# Patient Record
Sex: Female | Born: 1950 | Race: White | Hispanic: No | Marital: Married | Smoking: Never smoker
Health system: Southern US, Community
[De-identification: ages and names within clinical notes are randomized; demographics above are authoritative.]

## PROBLEM LIST (undated history)

## (undated) DIAGNOSIS — I10 Essential (primary) hypertension: Secondary | ICD-10-CM

## (undated) DIAGNOSIS — J45909 Unspecified asthma, uncomplicated: Secondary | ICD-10-CM

## (undated) DIAGNOSIS — E785 Hyperlipidemia, unspecified: Secondary | ICD-10-CM

## (undated) DIAGNOSIS — E119 Type 2 diabetes mellitus without complications: Secondary | ICD-10-CM

## (undated) HISTORY — PX: BARTHOLIN GLAND CYST EXCISION: SHX565

---

## 2004-06-12 ENCOUNTER — Ambulatory Visit: Payer: Self-pay | Admitting: General Practice

## 2004-06-18 ENCOUNTER — Ambulatory Visit: Payer: Self-pay | Admitting: General Practice

## 2004-08-16 ENCOUNTER — Ambulatory Visit: Payer: Self-pay | Admitting: Family Medicine

## 2004-08-22 ENCOUNTER — Ambulatory Visit: Payer: Self-pay | Admitting: Family Medicine

## 2005-02-03 ENCOUNTER — Ambulatory Visit: Payer: Self-pay | Admitting: General Surgery

## 2005-04-15 ENCOUNTER — Ambulatory Visit: Payer: Self-pay | Admitting: Unknown Physician Specialty

## 2005-08-07 ENCOUNTER — Ambulatory Visit: Payer: Self-pay | Admitting: General Practice

## 2006-08-11 ENCOUNTER — Ambulatory Visit: Payer: Self-pay | Admitting: Family Medicine

## 2007-08-17 ENCOUNTER — Ambulatory Visit: Payer: Self-pay | Admitting: Family Medicine

## 2007-08-28 ENCOUNTER — Ambulatory Visit: Payer: Self-pay | Admitting: Internal Medicine

## 2007-11-16 ENCOUNTER — Ambulatory Visit: Payer: Self-pay | Admitting: Gastroenterology

## 2008-08-17 ENCOUNTER — Ambulatory Visit: Payer: Self-pay | Admitting: Family Medicine

## 2008-08-31 ENCOUNTER — Ambulatory Visit: Payer: Self-pay | Admitting: Family Medicine

## 2009-11-27 ENCOUNTER — Ambulatory Visit: Payer: Self-pay | Admitting: Family Medicine

## 2010-12-25 ENCOUNTER — Ambulatory Visit: Payer: Self-pay

## 2011-12-02 ENCOUNTER — Ambulatory Visit: Payer: Self-pay

## 2012-01-21 HISTORY — PX: OTHER SURGICAL HISTORY: SHX169

## 2012-02-13 ENCOUNTER — Ambulatory Visit: Payer: Self-pay | Admitting: Unknown Physician Specialty

## 2012-12-02 ENCOUNTER — Ambulatory Visit: Payer: Self-pay | Admitting: Family Medicine

## 2013-11-02 ENCOUNTER — Ambulatory Visit: Payer: Self-pay | Admitting: Family Medicine

## 2013-12-05 ENCOUNTER — Ambulatory Visit: Payer: Self-pay | Admitting: Family Medicine

## 2014-01-20 HISTORY — PX: THYROID LOBECTOMY: SHX420

## 2014-01-30 ENCOUNTER — Ambulatory Visit: Payer: Self-pay | Admitting: Anesthesiology

## 2014-01-30 LAB — BASIC METABOLIC PANEL
ANION GAP: 7 (ref 7–16)
BUN: 19 mg/dL — ABNORMAL HIGH (ref 7–18)
CREATININE: 0.62 mg/dL (ref 0.60–1.30)
Calcium, Total: 8.9 mg/dL (ref 8.5–10.1)
Chloride: 104 mmol/L (ref 98–107)
Co2: 30 mmol/L (ref 21–32)
EGFR (Non-African Amer.): >60
GLUCOSE: 132 mg/dL — AB (ref 65–99)
Osmolality: 285 (ref 275–301)
Potassium: 3.5 mmol/L (ref 3.5–5.1)
Sodium: 141 mmol/L (ref 136–145)

## 2014-02-07 ENCOUNTER — Ambulatory Visit: Payer: Self-pay | Admitting: Surgery

## 2014-05-15 LAB — SURGICAL PATHOLOGY

## 2014-05-21 NOTE — Op Note (Signed)
PATIENT NAME:  Linda FlaxCANTRELL, [name] MR#:  [id number] DATE OF BIRTH:  [birth year]  DATE OF PROCEDURE:  02/07/2014  PREOPERATIVE DIAGNOSIS: Right thyroid colloid nodule.   POSTOPERATIVE DIAGNOSIS: Right thyroid colloid nodule.   PROCEDURE: Right thyroid lobectomy.   SURGEON: Renda RollsWilton Wynonna Fitzhenry, MD.   ANESTHESIA: General.   INDICATIONS: This 64 year old female has a 20 year history of a nodule on the right thyroid lobe which has recently been increasing in size, causing some difficulty breathing and difficulty swallowing and surgery was recommended for definitive treatment.   DESCRIPTION OF PROCEDURE: The patient was placed on the operating table in the supine position under general endotracheal anesthesia. A rolled sheet was placed behind the shoulder blades. The neck was extended. The neck and upper chest wall were prepared with ChloraPrep and draped in a sterile manner.   A collar incision was made approximately 1.5 cm above the sternal notch and carried down through subcutaneous tissues through the platysma. One traversing vein was divided between 4-0 Chromic suture ligatures. The strap muscles were identified. The sternocleidomastoid was dissected away from the strap muscles on the right side. The strap muscles were divided with the Harmonic scalpel on the right side and extending approximately 1 inch to the left of the midline. The strap muscles were separated from an underlying thyroid nodule as retraction was applied, exposure was achieved. The nodule appeared that it was approximately 3-4 cm in dimension and was mobilized with blunt and sharp dissection. The superior pole vessels were doubly ligated with 2-0 Vicryl and divided, the superior pole with the Harmonic scalpel. The nodule and lobe were further mobilized with additional blunt dissection. The superior and inferior parathyroid glands were identified and left intact. The inferior thyroidal artery was divided with the Harmonic scalpel and was  ligated with 3-0 Vicryl. It appeared that the dissection was enough anterior to avoid the recurrent laryngeal nerve, and further dissection was carried out with the Harmonic scalpel as the right lobe was excised and also divided. The isthmus and the thyroid were submitted for routine pathology. The wound was inspected and momentarily placed the patient in Trendelenburg position and saw no bleeding, was then returned to reverse Trendelenburg position and further demonstrated hemostasis was intact. Next the strap muscles were repaired with interrupted 3-0 Vicryl figure-of-eight sutures. The platysma was closed with interrupted 5-0 Monocryl. The skin was closed with running 5-0 Monocryl subcuticular suture and LiquiBand. The patient appeared to tolerate the procedure satisfactorily and was then prepared for transfer to the recovery room.     ____________________________ Shela CommonsJ. Renda RollsWilton Virginio Isidore, MD jws:bu D: 02/07/2014 14:26:47 ET T: 02/07/2014 17:02:48 ET JOB#: [id number]  cc: Adella HareJ. Wilton Ashlynd Michna, MD, <Dictator> Adella HareWILTON J Annison Birchard MD ELECTRONICALLY SIGNED 02/07/2014 18:39

## 2014-11-03 ENCOUNTER — Other Ambulatory Visit: Payer: Self-pay | Admitting: Family Medicine

## 2014-11-03 DIAGNOSIS — Z1231 Encounter for screening mammogram for malignant neoplasm of breast: Secondary | ICD-10-CM

## 2014-12-07 ENCOUNTER — Other Ambulatory Visit: Payer: Self-pay | Admitting: Family Medicine

## 2014-12-07 ENCOUNTER — Ambulatory Visit
Admission: RE | Admit: 2014-12-07 | Discharge: 2014-12-07 | Disposition: A | Discharge status: Home / Self Care | Payer: 59 | Source: Ambulatory Visit | Attending: Family Medicine | Admitting: Family Medicine

## 2014-12-07 DIAGNOSIS — Z1231 Encounter for screening mammogram for malignant neoplasm of breast: Secondary | ICD-10-CM

## 2014-12-07 DIAGNOSIS — N63 Unspecified lump in breast: Secondary | ICD-10-CM | POA: Diagnosis present

## 2014-12-07 DIAGNOSIS — N6320 Unspecified lump in the left breast, unspecified quadrant: Secondary | ICD-10-CM

## 2014-12-07 DIAGNOSIS — R928 Other abnormal and inconclusive findings on diagnostic imaging of breast: Secondary | ICD-10-CM | POA: Diagnosis not present

## 2015-11-01 ENCOUNTER — Other Ambulatory Visit: Payer: Self-pay | Admitting: Family Medicine

## 2015-11-01 DIAGNOSIS — Z1231 Encounter for screening mammogram for malignant neoplasm of breast: Secondary | ICD-10-CM

## 2015-12-10 ENCOUNTER — Ambulatory Visit: Payer: Self-pay

## 2015-12-11 ENCOUNTER — Ambulatory Visit
Admission: RE | Admit: 2015-12-11 | Discharge: 2015-12-11 | Disposition: A | Discharge status: Home / Self Care | Payer: BLUE CROSS/BLUE SHIELD | Source: Ambulatory Visit | Attending: Family Medicine | Admitting: Family Medicine

## 2015-12-11 DIAGNOSIS — Z1231 Encounter for screening mammogram for malignant neoplasm of breast: Secondary | ICD-10-CM | POA: Diagnosis not present

## 2016-11-04 ENCOUNTER — Other Ambulatory Visit: Payer: Self-pay | Admitting: Family Medicine

## 2016-11-04 DIAGNOSIS — Z1231 Encounter for screening mammogram for malignant neoplasm of breast: Secondary | ICD-10-CM

## 2016-12-15 ENCOUNTER — Ambulatory Visit
Admission: RE | Admit: 2016-12-15 | Discharge: 2016-12-15 | Disposition: A | Discharge status: Home / Self Care | Payer: Medicare HMO | Source: Ambulatory Visit | Attending: Family Medicine | Admitting: Family Medicine

## 2016-12-15 DIAGNOSIS — Z1231 Encounter for screening mammogram for malignant neoplasm of breast: Secondary | ICD-10-CM | POA: Insufficient documentation

## 2017-02-18 ENCOUNTER — Other Ambulatory Visit: Payer: Self-pay | Admitting: Family Medicine

## 2017-02-18 DIAGNOSIS — N6321 Unspecified lump in the left breast, upper outer quadrant: Secondary | ICD-10-CM

## 2017-02-18 DIAGNOSIS — N644 Mastodynia: Secondary | ICD-10-CM

## 2017-02-24 ENCOUNTER — Ambulatory Visit
Admission: RE | Admit: 2017-02-24 | Discharge: 2017-02-24 | Disposition: A | Discharge status: Home / Self Care | Payer: Medicare HMO | Source: Ambulatory Visit | Attending: Family Medicine | Admitting: Family Medicine

## 2017-02-24 DIAGNOSIS — N644 Mastodynia: Secondary | ICD-10-CM

## 2017-02-24 DIAGNOSIS — R928 Other abnormal and inconclusive findings on diagnostic imaging of breast: Secondary | ICD-10-CM | POA: Insufficient documentation

## 2017-02-24 DIAGNOSIS — N6321 Unspecified lump in the left breast, upper outer quadrant: Secondary | ICD-10-CM | POA: Insufficient documentation

## 2017-06-08 ENCOUNTER — Encounter: Payer: Self-pay | Admitting: *Deleted

## 2017-06-09 ENCOUNTER — Ambulatory Visit
Admission: RE | Admit: 2017-06-09 | Discharge: 2017-06-09 | Disposition: A | Discharge status: Home / Self Care | Payer: Medicare HMO | Source: Ambulatory Visit | Attending: Gastroenterology | Admitting: Gastroenterology

## 2017-06-09 ENCOUNTER — Ambulatory Visit: Payer: Medicare HMO | Admitting: Registered Nurse

## 2017-06-09 ENCOUNTER — Encounter: Payer: Self-pay | Admitting: *Deleted

## 2017-06-09 ENCOUNTER — Encounter
Admission: RE | Disposition: A | Discharge status: Home / Self Care | Payer: Self-pay | Source: Ambulatory Visit | Attending: Gastroenterology

## 2017-06-09 DIAGNOSIS — E785 Hyperlipidemia, unspecified: Secondary | ICD-10-CM | POA: Diagnosis not present

## 2017-06-09 DIAGNOSIS — Z7982 Long term (current) use of aspirin: Secondary | ICD-10-CM | POA: Insufficient documentation

## 2017-06-09 DIAGNOSIS — Z79899 Other long term (current) drug therapy: Secondary | ICD-10-CM | POA: Diagnosis not present

## 2017-06-09 DIAGNOSIS — E119 Type 2 diabetes mellitus without complications: Secondary | ICD-10-CM | POA: Insufficient documentation

## 2017-06-09 DIAGNOSIS — Z7984 Long term (current) use of oral hypoglycemic drugs: Secondary | ICD-10-CM | POA: Insufficient documentation

## 2017-06-09 DIAGNOSIS — K5730 Diverticulosis of large intestine without perforation or abscess without bleeding: Secondary | ICD-10-CM | POA: Diagnosis present

## 2017-06-09 DIAGNOSIS — I10 Essential (primary) hypertension: Secondary | ICD-10-CM | POA: Insufficient documentation

## 2017-06-09 DIAGNOSIS — Z8371 Family history of colonic polyps: Secondary | ICD-10-CM | POA: Diagnosis not present

## 2017-06-09 DIAGNOSIS — J45909 Unspecified asthma, uncomplicated: Secondary | ICD-10-CM | POA: Diagnosis not present

## 2017-06-09 HISTORY — DX: Hyperlipidemia, unspecified: E78.5

## 2017-06-09 HISTORY — DX: Type 2 diabetes mellitus without complications: E11.9

## 2017-06-09 HISTORY — DX: Unspecified asthma, uncomplicated: J45.909

## 2017-06-09 HISTORY — DX: Essential (primary) hypertension: I10

## 2017-06-09 HISTORY — PX: COLONOSCOPY WITH PROPOFOL: SHX5780

## 2017-06-09 LAB — GLUCOSE, CAPILLARY: GLUCOSE-CAPILLARY: 101 mg/dL — AB (ref 65–99)

## 2017-06-09 SURGERY — COLONOSCOPY WITH PROPOFOL
Anesthesia: General

## 2017-06-09 MED ORDER — LIDOCAINE HCL (PF) 2 % IJ SOLN
INTRAMUSCULAR | Status: AC
  Filled 2017-06-09: qty 10

## 2017-06-09 MED ORDER — PROPOFOL 10 MG/ML IV BOLUS
INTRAVENOUS | Status: DC | PRN
  Administered 2017-06-09: 60 mg via INTRAVENOUS

## 2017-06-09 MED ORDER — SODIUM CHLORIDE 0.9 % IV SOLN
INTRAVENOUS | Status: DC
  Administered 2017-06-09: 08:00:00 via INTRAVENOUS

## 2017-06-09 MED ORDER — GLYCOPYRROLATE 0.2 MG/ML IJ SOLN
INTRAMUSCULAR | Status: AC
  Filled 2017-06-09: qty 1

## 2017-06-09 MED ORDER — PROPOFOL 500 MG/50ML IV EMUL
INTRAVENOUS | Status: DC | PRN
  Administered 2017-06-09: 185 ug/kg/min via INTRAVENOUS

## 2017-06-09 MED ORDER — ONDANSETRON HCL 4 MG/2ML IJ SOLN
INTRAMUSCULAR | Status: DC | PRN
  Administered 2017-06-09: 4 mg via INTRAVENOUS

## 2017-06-09 MED ORDER — LIDOCAINE HCL (CARDIAC) PF 100 MG/5ML IV SOSY
PREFILLED_SYRINGE | INTRAVENOUS | Status: DC | PRN
  Administered 2017-06-09: 80 mg via INTRAVENOUS

## 2017-06-09 MED ORDER — PROPOFOL 500 MG/50ML IV EMUL
INTRAVENOUS | Status: AC
  Filled 2017-06-09: qty 50

## 2017-06-09 MED ORDER — SUCCINYLCHOLINE CHLORIDE 20 MG/ML IJ SOLN
INTRAMUSCULAR | Status: AC
  Filled 2017-06-09: qty 1

## 2017-06-09 NOTE — Anesthesia Postprocedure Evaluation (Signed)
Anesthesia Post Note  Patient: Linda Harrison  Procedure(s) Performed: COLONOSCOPY WITH PROPOFOL (N/A )  Patient location during evaluation: Endoscopy Anesthesia Type: General Level of consciousness: awake and alert Pain management: pain level controlled Vital Signs Assessment: post-procedure vital signs reviewed and stable Respiratory status: spontaneous breathing, nonlabored ventilation, respiratory function stable and patient connected to nasal cannula oxygen Cardiovascular status: blood pressure returned to baseline and stable Postop Assessment: no apparent nausea or vomiting Anesthetic complications: no     Last Vitals:  Vitals:   06/09/17 0806 06/09/17 0836  BP: (!) 93/56 (!) 148/72  Pulse:    Resp: 20   Temp: (!) 36.1 C   SpO2:      Last Pain:  Vitals:   06/09/17 0843  TempSrc:   PainSc: 0-No pain                 Lenard Simmer

## 2017-06-09 NOTE — Op Note (Signed)
[facility] Patient Name: Linda Harrison Procedure Date: 06/09/2017 7:36 AM MRN: [id number] Account #: [id number] Date of Birth: 1950-07-25 Admit Type: Outpatient Age: 67 Room: [facility] ENDO ROOM 3 Gender: Female Note Status: Finalized Procedure:            Colonoscopy Indications:          Family history of colonic polyps in a first-degree                        relative Providers:            Christena Deem, MD Referring MD:         Rhona Leavens. Burnett Sheng, MD (Referring MD) Medicines:            Monitored Anesthesia Care Complications:        No immediate complications. Procedure:            Pre-Anesthesia Assessment:                       - ASA Grade Assessment: II - A patient with mild                        systemic disease.                       After obtaining informed consent, the colonoscope was                        passed under direct vision. Throughout the procedure,                        the patient's blood pressure, pulse, and oxygen                        saturations were monitored continuously. The                        Colonoscope was introduced through the anus and                        advanced to the the cecum, identified by appendiceal                        orifice and ileocecal valve. The colonoscopy was                        performed without difficulty. The patient tolerated the                        procedure well. The quality of the bowel preparation                        was fair. Findings:      Many medium-mouthed diverticula were found in the sigmoid colon and       descending colon.      The exam was otherwise normal throughout the examined colon.      The retroflexed view of the distal rectum and anal verge was normal and       showed no anal or rectal abnormalities.      The digital rectal exam was normal. Impression:           -  Preparation of the colon was fair.                       - Diverticulosis in the  sigmoid colon and in the                        descending colon.                       - The distal rectum and anal verge are normal on                        retroflexion view.                       - No specimens collected. Recommendation:       - Repeat colonoscopy in 5 years for screening purposes. Procedure Code(s):    --- Professional ---                       [phone], Colonoscopy, flexible; diagnostic, including                        collection of specimen(s) by brushing or washing, when                        performed (separate procedure) Diagnosis Code(s):    --- Professional ---                       Z83.71, Family history of colonic polyps                       K57.30, Diverticulosis of large intestine without                        perforation or abscess without bleeding CPT copyright 2017 [employer]. All rights reserved. The codes documented in this report are preliminary and upon coder review may  be revised to meet current compliance requirements. Christena Deem, MD 06/09/2017 8:04:32 AM This report has been signed electronically. Number of Addenda: 0 Note Initiated On: 06/09/2017 7:36 AM Scope Withdrawal Time: 0 hours 11 minutes 25 seconds  Total Procedure Duration: 0 hours 19 minutes 30 seconds       [facility]

## 2017-06-09 NOTE — Transfer of Care (Signed)
Immediate Anesthesia Transfer of Care Note  Patient: Linda Harrison  Procedure(s) Performed: COLONOSCOPY WITH PROPOFOL (N/A )  Patient Location: PACU  Anesthesia Type:General  Level of Consciousness: awake  Airway & Oxygen Therapy: Patient Spontanous Breathing  Post-op Assessment: Post -op Vital signs reviewed and stable  Post vital signs: Reviewed and stable  Last Vitals:  Vitals Value Taken Time  BP 93/56 06/09/2017  8:07 AM  Temp 36.1 C 06/09/2017  8:06 AM  Pulse 60 06/09/2017  8:08 AM  Resp 15 06/09/2017  8:08 AM  SpO2 99 % 06/09/2017  8:08 AM  Vitals shown include unvalidated device data.  Last Pain:  Vitals:   06/09/17 0806  TempSrc: Tympanic  PainSc: 0-No pain         Complications: No apparent anesthesia complications

## 2017-06-09 NOTE — Anesthesia Preprocedure Evaluation (Addendum)
Anesthesia Evaluation  Patient identified by MRN, date of birth, ID band Patient awake    Reviewed: Allergy & Precautions, H&P , NPO status , Patient's Chart, lab work & pertinent test results, reviewed documented beta blocker date and time   Airway Mallampati: I  TM Distance: >3 FB Neck ROM: full    Dental  (+) Dental Advidsory Given, Caps Permanent bridge on the bottom right:   Pulmonary neg shortness of breath, asthma , neg sleep apnea, neg recent URI,           Cardiovascular Exercise Tolerance: Good hypertension, (-) angina(-) CAD, (-) Past MI and (-) Cardiac Stents (-) dysrhythmias (-) Valvular Problems/Murmurs     Neuro/Psych negative neurological ROS  negative psych ROS   GI/Hepatic negative GI ROS, Neg liver ROS,   Endo/Other  diabetes  Renal/GU negative Renal ROS  negative genitourinary   Musculoskeletal   Abdominal   Peds  Hematology negative hematology ROS (+)   Anesthesia Other Findings Past Medical History: No date: Asthma No date: Diabetes mellitus without complication (HCC) No date: Hyperlipidemia No date: Hypertension   Reproductive/Obstetrics negative OB ROS                            Anesthesia Physical Anesthesia Plan  ASA: II  Anesthesia Plan: General   Post-op Pain Management:    Induction: Intravenous  PONV Risk Score and Plan: 3 and Propofol infusion  Airway Management Planned: Natural Airway and Nasal Cannula  Additional Equipment:   Intra-op Plan:   Post-operative Plan:   Informed Consent: I have reviewed the patients History and Physical, chart, labs and discussed the procedure including the risks, benefits and alternatives for the proposed anesthesia with the patient or authorized representative who has indicated his/her understanding and acceptance.   Dental Advisory Given  Plan Discussed with: Anesthesiologist, CRNA and Surgeon  Anesthesia  Plan Comments:         Anesthesia Quick Evaluation

## 2017-06-09 NOTE — Anesthesia Post-op Follow-up Note (Signed)
Anesthesia QCDR form completed.        

## 2017-06-09 NOTE — H&P (Signed)
Outpatient short stay form Pre-procedure 06/09/2017 7:11 AM Christena Deem MD  Primary Physician: Jerl Mina, MD  Reason for visit: Colonoscopy  History of present illness: Patient is a 67 year old female with a family history of colon polyps in a primary relative.  Patient's last colonoscopy was 11/16/2007 with a finding of diverticulosis and some small nonbleeding internal hemorrhoids.    Current Facility-Administered Medications:  .  0.9 %  sodium chloride infusion, , Intravenous, Continuous, Christena Deem, MD .  0.9 %  sodium chloride infusion, , Intravenous, Continuous, Christena Deem, MD  Medications Prior to Admission  Medication Sig Dispense Refill Last Dose  . albuterol (PROVENTIL HFA;VENTOLIN HFA) 108 (90 Base) MCG/ACT inhaler Inhale 2 puffs into the lungs every 6 (six) hours as needed for wheezing or shortness of breath.     Marland Kitchen amLODipine (NORVASC) 5 MG tablet Take 5 mg by mouth daily.     Marland Kitchen aspirin EC 81 MG tablet Take 81 mg by mouth daily.     . bisoprolol-hydrochlorothiazide (ZIAC) 10-6.25 MG tablet Take 1 tablet by mouth daily.     . budesonide-formoterol (SYMBICORT) 160-4.5 MCG/ACT inhaler Inhale 2 puffs into the lungs 2 (two) times daily.     . cetirizine (ZYRTEC) 5 MG tablet Take 5 mg by mouth daily.     Marland Kitchen econazole nitrate 1 % cream Apply topically daily.     . Glucose Blood (ONETOUCH VERIO VI) by In Vitro route.     Marland Kitchen ibuprofen (ADVIL,MOTRIN) 200 MG tablet Take 200 mg by mouth every 6 (six) hours as needed.     . Lancets MISC by Does not apply route 3 (three) times daily.     . metFORMIN (GLUCOPHAGE) 1000 MG tablet Take 1,000 mg by mouth 2 (two) times daily with a meal.     . Multiple Vitamin (MULTIVITAMIN) tablet Take 1 tablet by mouth daily.     . mupirocin ointment (BACTROBAN) 2 % Place 1 application into the nose 3 (three) times daily.     Marland Kitchen omega-3 acid ethyl esters (LOVAZA) 1 g capsule Take 1 g by mouth 2 (two) times daily.     Marland Kitchen omeprazole  (PRILOSEC) 20 MG capsule Take 20 mg by mouth daily.     . simvastatin (ZOCOR) 20 MG tablet Take 20 mg by mouth daily.        Allergies  Allergen Reactions  . Advair Diskus [Fluticasone-Salmeterol]   . Augmentin [Amoxicillin-Pot Clavulanate]   . Codeine   . Latex      Past Medical History:  Diagnosis Date  . Asthma   . Diabetes mellitus without complication (HCC)   . Hyperlipidemia   . Hypertension     Review of systems:      Physical Exam    Heart and lungs: Regular rate and rhythm without rub or gallop, lungs are bilaterally clear.    HEENT: Normocephalic atraumatic eyes are anicteric    Other:    Pertinant exam for procedure: Soft nontender nondistended bowel sounds positive normoactive    Planned proceedures: Colonoscopy and indicated procedures.  I have discussed the risks benefits and complications of procedures to include not limited to bleeding, infection, perforation and the risk of sedation and the patient wishes to proceed.     Christena Deem, MD Gastroenterology 06/09/2017  7:11 AM

## 2017-06-11 ENCOUNTER — Encounter: Payer: Self-pay | Admitting: Gastroenterology

## 2017-10-15 ENCOUNTER — Other Ambulatory Visit: Payer: Self-pay | Admitting: Family Medicine

## 2017-10-15 DIAGNOSIS — Z1231 Encounter for screening mammogram for malignant neoplasm of breast: Secondary | ICD-10-CM

## 2017-12-23 ENCOUNTER — Ambulatory Visit
Admission: RE | Admit: 2017-12-23 | Discharge: 2017-12-23 | Disposition: A | Discharge status: Home / Self Care | Payer: Medicare HMO | Source: Ambulatory Visit | Attending: Family Medicine | Admitting: Family Medicine

## 2017-12-23 DIAGNOSIS — Z1231 Encounter for screening mammogram for malignant neoplasm of breast: Secondary | ICD-10-CM

## 2018-06-25 ENCOUNTER — Emergency Department
Admission: EM | Admit: 2018-06-25 | Discharge: 2018-06-25 | Disposition: A | Discharge status: Home / Self Care | Payer: Medicare HMO | Attending: Emergency Medicine | Admitting: Emergency Medicine

## 2018-06-25 ENCOUNTER — Encounter: Payer: Self-pay | Admitting: Emergency Medicine

## 2018-06-25 ENCOUNTER — Other Ambulatory Visit: Payer: Self-pay

## 2018-06-25 ENCOUNTER — Emergency Department: Payer: Medicare HMO

## 2018-06-25 DIAGNOSIS — I10 Essential (primary) hypertension: Secondary | ICD-10-CM | POA: Insufficient documentation

## 2018-06-25 DIAGNOSIS — M7660 Achilles tendinitis, unspecified leg: Secondary | ICD-10-CM

## 2018-06-25 DIAGNOSIS — Z91040 Latex allergy status: Secondary | ICD-10-CM | POA: Diagnosis not present

## 2018-06-25 DIAGNOSIS — M25571 Pain in right ankle and joints of right foot: Secondary | ICD-10-CM | POA: Insufficient documentation

## 2018-06-25 DIAGNOSIS — E119 Type 2 diabetes mellitus without complications: Secondary | ICD-10-CM | POA: Insufficient documentation

## 2018-06-25 DIAGNOSIS — J45909 Unspecified asthma, uncomplicated: Secondary | ICD-10-CM | POA: Insufficient documentation

## 2018-06-25 DIAGNOSIS — Z7984 Long term (current) use of oral hypoglycemic drugs: Secondary | ICD-10-CM | POA: Diagnosis not present

## 2018-06-25 DIAGNOSIS — Z79899 Other long term (current) drug therapy: Secondary | ICD-10-CM | POA: Diagnosis not present

## 2018-06-25 DIAGNOSIS — Z7982 Long term (current) use of aspirin: Secondary | ICD-10-CM | POA: Diagnosis not present

## 2018-06-25 MED ORDER — ONDANSETRON HCL 4 MG PO TABS: 4.0000 mg | ORAL_TABLET | Freq: Three times a day (TID) | ORAL | 0 refills | Status: DC | PRN

## 2018-06-25 MED ORDER — TRAMADOL HCL 50 MG PO TABS
50.0000 mg | ORAL_TABLET | Freq: Once | ORAL | Status: AC
  Administered 2018-06-25: 50 mg via ORAL
  Filled 2018-06-25: qty 1

## 2018-06-25 MED ORDER — TRAMADOL HCL 50 MG PO TABS: 50.0000 mg | ORAL_TABLET | Freq: Four times a day (QID) | ORAL | 0 refills | Status: AC | PRN

## 2018-06-25 MED ORDER — TRAMADOL HCL 50 MG PO TABS: 50.0000 mg | ORAL_TABLET | Freq: Four times a day (QID) | ORAL | 0 refills | Status: DC | PRN

## 2018-06-25 MED ORDER — FREE SPIRIT KNEE/LEG WALKER MISC: 1.0000 | Freq: Once | 0 refills | Status: AC

## 2018-06-25 MED ORDER — ONDANSETRON 4 MG PO TBDP
4.0000 mg | ORAL_TABLET | Freq: Once | ORAL | Status: AC
  Administered 2018-06-25: 4 mg via ORAL
  Filled 2018-06-25: qty 1

## 2018-06-25 MED ORDER — ONDANSETRON HCL 4 MG PO TABS: 4.0000 mg | ORAL_TABLET | Freq: Three times a day (TID) | ORAL | 0 refills | Status: AC | PRN

## 2018-06-25 NOTE — ED Notes (Signed)
See triage note  Presents with right ankle pain  States she felt a pop to ankle while walking   No deformity noted  Good pulses

## 2018-06-25 NOTE — ED Provider Notes (Signed)
[facility] Emergency Department Provider Note  ____________________________________________  Time seen: Approximately 7:37 PM  I have reviewed the triage vital signs and the nursing notes.   HISTORY  Chief Complaint Ankle Pain    HPI Linda Harrison is a 68 y.o. female presents to the emergency department with acute posterior right ankle pain.  Patient reports that she was walking in the kitchen and felt a popping sensation.  Patient states that she has been under the care of Dr. Alberteen Spindle for the past year for Achilles tendinitis and conveys that Dr. Alberteen Spindle told her she was at increased risk for Achilles tendon rupture or partial rupture.  Patient has not been able to fully bear weight since injury occurred without pain.  She denies numbness or tingling in the lower extremities or coldness.  She has not experience prior Achilles tendon rupture type injuries.  No other alleviating measures have been attempted.        Past Medical History:  Diagnosis Date  . Asthma   . Diabetes mellitus without complication (HCC)   . Hyperlipidemia   . Hypertension     There are no active problems to display for this patient.   Past Surgical History:  Procedure Laterality Date  . ARTHROSCOPY OF KNEE Left 01/2012  . BARTHOLIN GLAND CYST EXCISION    . CESAREAN SECTION  1979  . COLONOSCOPY WITH PROPOFOL N/A 06/09/2017   Procedure: COLONOSCOPY WITH PROPOFOL;  Surgeon: Christena Deem, MD;  Location: [facility] ENDOSCOPY;  Service: Endoscopy;  Laterality: N/A;  . THYROID LOBECTOMY Right 01/2014    Prior to Admission medications   Medication Sig Start Date End Date Taking? Authorizing Provider  albuterol (PROVENTIL HFA;VENTOLIN HFA) 108 (90 Base) MCG/ACT inhaler Inhale 2 puffs into the lungs every 6 (six) hours as needed for wheezing or shortness of breath.    [provider]  amLODipine (NORVASC) 5 MG tablet Take 5 mg by mouth daily.    [provider]   aspirin EC 81 MG tablet Take 81 mg by mouth daily.    [provider]  bisoprolol-hydrochlorothiazide (ZIAC) 10-6.25 MG tablet Take 1 tablet by mouth daily.    [provider]  budesonide-formoterol (SYMBICORT) 160-4.5 MCG/ACT inhaler Inhale 2 puffs into the lungs 2 (two) times daily.    [provider]  cetirizine (ZYRTEC) 5 MG tablet Take 5 mg by mouth daily.    [provider]  econazole nitrate 1 % cream Apply topically daily.    [provider]  Glucose Blood (ONETOUCH VERIO VI) by In Vitro route.    [provider]  ibuprofen (ADVIL,MOTRIN) 200 MG tablet Take 200 mg by mouth every 6 (six) hours as needed.    [provider]  Lancets MISC by Does not apply route 3 (three) times daily.    [provider]  metFORMIN (GLUCOPHAGE) 1000 MG tablet Take 1,000 mg by mouth 2 (two) times daily with a meal.    [provider]  Misc. Devices (FREE SPIRIT KNEE/LEG WALKER) MISC 1 Device by Does not apply route once for 1 dose. 06/25/18 06/25/18  Orvil Feil, PA-C  Multiple Vitamin (MULTIVITAMIN) tablet Take 1 tablet by mouth daily.    [provider]  mupirocin ointment (BACTROBAN) 2 % Place 1 application into the nose 3 (three) times daily.    [provider]  omega-3 acid ethyl esters (LOVAZA) 1 g capsule Take 1 g by mouth 2 (two) times daily.    [provider]  omeprazole (PRILOSEC) 20 MG capsule Take 20 mg by mouth daily.    [provider]  ondansetron (ZOFRAN) 4 MG tablet Take 1 tablet (4 mg total) by mouth every 8 (eight) hours as needed for up to 5 days for nausea or vomiting. 06/25/18 06/30/18  Orvil Feil, PA-C  simvastatin (ZOCOR) 20 MG tablet Take 20 mg by mouth daily.    [provider]  traMADol (ULTRAM) 50 MG tablet Take 1 tablet (50 mg total) by mouth every 6 (six) hours as needed for up to 3 days. 06/25/18 06/28/18  Orvil Feil, PA-C    Allergies Advair diskus  [fluticasone-salmeterol]; Augmentin [amoxicillin-pot clavulanate]; Codeine; and Latex  Family History  Problem Relation Age of Onset  . Breast cancer Sister 47  . Breast cancer Sister 41    Social History Social History   Tobacco Use  . Smoking status: Never Smoker  . Smokeless tobacco: Never Used  Substance Use Topics  . Alcohol use: Never    Frequency: Never  . Drug use: Never     Review of Systems  Constitutional: No fever/chills Eyes: No visual changes. No discharge ENT: No upper respiratory complaints. Cardiovascular: no chest pain. Respiratory: no cough. No SOB. Gastrointestinal: No abdominal pain.  No nausea, no vomiting.  No diarrhea.  No constipation. Musculoskeletal: Patient has right posterior ankle pain.  Skin: Negative for rash, abrasions, lacerations, ecchymosis. Neurological: Negative for headaches, focal weakness or numbness.   ____________________________________________   PHYSICAL EXAM:  VITAL SIGNS: ED Triage Vitals  Enc Vitals Group     BP 06/25/18 1801 (!) 168/66     Pulse Rate 06/25/18 1801 67     Resp 06/25/18 1801 16     Temp 06/25/18 1801 98.6 F (37 C)     Temp Source 06/25/18 1801 Oral     SpO2 06/25/18 1801 98 %     Weight --      Height --      Head Circumference --      Peak Flow --      Pain Score 06/25/18 1802 1     Pain Loc --      Pain Edu? --      Excl. in GC? --      Constitutional: Alert and oriented. Well appearing and in no acute distress. Eyes: Conjunctivae are normal. PERRL. EOMI. Head: Atraumatic. Cardiovascular: Normal rate, regular rhythm. Normal S1 and S2.  Good peripheral circulation. Respiratory: Normal respiratory effort without tachypnea or retractions. Lungs CTAB. Good air entry to the bases with no decreased or absent breath sounds. Musculoskeletal: Patient is unable to perform full range of motion at the right ankle, likely secondary to pain.  Patient has severe pain with dorsi flexion at the right  ankle and palpation over the distal Achilles tendon.  She also has some mild calf pain to palpation.  She is able to move all 5 right toes.  Palpable dorsalis pedis pulse, right Neurologic:  Normal speech and language. No gross focal neurologic deficits are appreciated.  Skin:  Skin is warm, dry and intact. No rash noted. Psychiatric: Mood and affect are normal. Speech and behavior are normal. Patient exhibits appropriate insight and judgement.   ____________________________________________   LABS (all labs ordered are listed, but only abnormal results are displayed)  Labs Reviewed - No data to display ____________________________________________  EKG   ____________________________________________  RADIOLOGY I personally viewed and evaluated these images as part of my medical decision making,  as well as reviewing the written report by the radiologist.  Dg Ankle Complete Right  Result Date: 06/25/2018 CLINICAL DATA:  Pain EXAM: RIGHT ANKLE - COMPLETE 3+ VIEW COMPARISON:  None. FINDINGS: There is soft tissue swelling about the medial malleolus. There is no evidence of a displaced fracture or dislocation. There are mild degenerative changes of the ankle mortise. There is a moderate-sized plantar calcaneal spur. There is an Achilles tendon enthesophyte. IMPRESSION: Soft tissue swelling about the lateral malleolus without evidence of a displaced fracture or dislocation. If an occult fracture is suspected, follow-up radiographs are recommended in 10-14 days. Electronically Signed   By: Katherine Mantlehristopher  Green M.D.   On: 06/25/2018 19:03    ____________________________________________    PROCEDURES  Procedure(s) performed:    Procedures    Medications  traMADol (ULTRAM) tablet 50 mg (50 mg Oral Given 06/25/18 1948)  ondansetron (ZOFRAN-ODT) disintegrating tablet 4 mg (4 mg Oral Given 06/25/18 1948)     ____________________________________________   INITIAL IMPRESSION / ASSESSMENT AND  PLAN / ED COURSE  Pertinent labs & imaging results that were available during my care of the patient were reviewed by me and considered in my medical decision making (see chart for details).  Review of the [place] CSRS was performed in accordance of the [facility] prior to dispensing any controlled drugs.  Clinical Course as of Jun 24 2148  Fri Jun 25, 2018  1933 DG Ankle Complete Right [JW]    Clinical Course User Index [JW] Orvil FeilWoods, Niklas Chretien M, PA-C          Assessment and Plan: Achilles Tendon Rupture:  68 year old female presents to the emergency department with right posterior ankle pain after a history of recent Achilles tendinitis for the past year.  Patient reported hearing a popping sensation when pain occurred.  On physical exam, patient has exquisite tenderness with palpation over the distal Achilles tendon and has some mild tenderness to palpation over the right calf.  Partial Achilles tendon rupture is likely.  Patient was splinted in plantar flexion and advised to follow-up with podiatrist, Dr. Alberteen Spindleline.  Patient was discharged with a short course of Tramadol. All patient questions were answered.     ____________________________________________  FINAL CLINICAL IMPRESSION(S) / ED DIAGNOSES  Final diagnoses:  Achilles tendon pain      NEW MEDICATIONS STARTED DURING THIS VISIT:  ED Discharge Orders         Ordered    traMADol (ULTRAM) 50 MG tablet  Every 6 hours PRN,   Status:  Discontinued     06/25/18 2115    ondansetron (ZOFRAN) 4 MG tablet  Every 8 hours PRN,   Status:  Discontinued     06/25/18 2115    ondansetron (ZOFRAN) 4 MG tablet  Every 8 hours PRN     06/25/18 2116    traMADol (ULTRAM) 50 MG tablet  Every 6 hours PRN     06/25/18 2116    Misc. Devices (FREE SPIRIT KNEE/LEG WALKER) MISC   Once     06/25/18 2118              This chart was dictated using voice recognition software/Dragon. Despite best efforts to proofread, errors can occur which can change  the meaning. Any change was purely unintentional.    Orvil FeilWoods, Salvador Coupe M, PA-C 06/25/18 2150    Dionne BucySiadecki, Sebastian, MD 06/25/18 2217

## 2018-06-25 NOTE — ED Triage Notes (Signed)
Pt to ED via POV c/o right ankle pain. Pt states that she was walking down the hall and heard something pop in her ankle. Pt is having pain and swelling. PT states that she is currently being treated for tendonitis in right ankle. Pt is in NAD.

## 2018-06-28 ENCOUNTER — Other Ambulatory Visit: Payer: Self-pay | Admitting: Podiatry

## 2018-06-28 DIAGNOSIS — S86011A Strain of right Achilles tendon, initial encounter: Secondary | ICD-10-CM

## 2018-06-30 ENCOUNTER — Other Ambulatory Visit: Payer: Self-pay

## 2018-06-30 ENCOUNTER — Ambulatory Visit
Admission: RE | Admit: 2018-06-30 | Discharge: 2018-06-30 | Disposition: A | Discharge status: Home / Self Care | Payer: Medicare HMO | Source: Ambulatory Visit | Attending: Podiatry | Admitting: Podiatry

## 2018-06-30 DIAGNOSIS — S86011A Strain of right Achilles tendon, initial encounter: Secondary | ICD-10-CM | POA: Insufficient documentation

## 2018-07-01 ENCOUNTER — Other Ambulatory Visit: Payer: Self-pay | Admitting: Podiatry

## 2018-07-01 ENCOUNTER — Ambulatory Visit: Payer: Self-pay | Admitting: Podiatry

## 2018-07-01 ENCOUNTER — Other Ambulatory Visit
Admission: RE | Admit: 2018-07-01 | Discharge: 2018-07-01 | Disposition: A | Discharge status: Home / Self Care | Payer: Medicare HMO | Source: Ambulatory Visit | Attending: Podiatry | Admitting: Podiatry

## 2018-07-01 DIAGNOSIS — Z1159 Encounter for screening for other viral diseases: Secondary | ICD-10-CM | POA: Diagnosis present

## 2018-07-01 LAB — SARS CORONAVIRUS 2 BY RT PCR (HOSPITAL ORDER, PERFORMED IN ~~LOC~~ HOSPITAL LAB): SARS Coronavirus 2: NEGATIVE

## 2018-07-01 MED ORDER — CLINDAMYCIN PHOSPHATE 900 MG/50ML IV SOLN
900.0000 mg | INTRAVENOUS | Status: AC
  Administered 2018-07-02: 900 mg via INTRAVENOUS

## 2018-07-02 ENCOUNTER — Encounter
Admission: RE | Disposition: A | Discharge status: Home / Self Care | Payer: Self-pay | Source: Home / Self Care | Attending: Podiatry

## 2018-07-02 ENCOUNTER — Ambulatory Visit
Admission: RE | Admit: 2018-07-02 | Discharge: 2018-07-02 | Disposition: A | Discharge status: Home / Self Care | Payer: Medicare HMO | Attending: Podiatry | Admitting: Podiatry

## 2018-07-02 ENCOUNTER — Ambulatory Visit: Payer: Medicare HMO | Admitting: Anesthesiology

## 2018-07-02 ENCOUNTER — Encounter: Payer: Self-pay | Admitting: *Deleted

## 2018-07-02 DIAGNOSIS — J45909 Unspecified asthma, uncomplicated: Secondary | ICD-10-CM | POA: Diagnosis not present

## 2018-07-02 DIAGNOSIS — S86011A Strain of right Achilles tendon, initial encounter: Secondary | ICD-10-CM | POA: Insufficient documentation

## 2018-07-02 DIAGNOSIS — Z79899 Other long term (current) drug therapy: Secondary | ICD-10-CM | POA: Diagnosis not present

## 2018-07-02 DIAGNOSIS — E890 Postprocedural hypothyroidism: Secondary | ICD-10-CM | POA: Insufficient documentation

## 2018-07-02 DIAGNOSIS — Z91040 Latex allergy status: Secondary | ICD-10-CM | POA: Insufficient documentation

## 2018-07-02 DIAGNOSIS — Y9301 Activity, walking, marching and hiking: Secondary | ICD-10-CM | POA: Diagnosis not present

## 2018-07-02 DIAGNOSIS — I10 Essential (primary) hypertension: Secondary | ICD-10-CM | POA: Diagnosis not present

## 2018-07-02 DIAGNOSIS — Z803 Family history of malignant neoplasm of breast: Secondary | ICD-10-CM | POA: Diagnosis not present

## 2018-07-02 DIAGNOSIS — Z7951 Long term (current) use of inhaled steroids: Secondary | ICD-10-CM | POA: Diagnosis not present

## 2018-07-02 DIAGNOSIS — E119 Type 2 diabetes mellitus without complications: Secondary | ICD-10-CM | POA: Diagnosis not present

## 2018-07-02 DIAGNOSIS — Z888 Allergy status to other drugs, medicaments and biological substances status: Secondary | ICD-10-CM | POA: Diagnosis not present

## 2018-07-02 DIAGNOSIS — E785 Hyperlipidemia, unspecified: Secondary | ICD-10-CM | POA: Diagnosis not present

## 2018-07-02 DIAGNOSIS — M7731 Calcaneal spur, right foot: Secondary | ICD-10-CM | POA: Diagnosis not present

## 2018-07-02 DIAGNOSIS — Z885 Allergy status to narcotic agent status: Secondary | ICD-10-CM | POA: Diagnosis not present

## 2018-07-02 DIAGNOSIS — Z881 Allergy status to other antibiotic agents status: Secondary | ICD-10-CM | POA: Insufficient documentation

## 2018-07-02 HISTORY — PX: OSTECTOMY: SHX6439

## 2018-07-02 HISTORY — PX: ACHILLES TENDON SURGERY: SHX542

## 2018-07-02 LAB — GLUCOSE, CAPILLARY
Glucose-Capillary: 105 mg/dL — ABNORMAL HIGH (ref 70–99)
Glucose-Capillary: 121 mg/dL — ABNORMAL HIGH (ref 70–99)
Glucose-Capillary: 101 mg/dL — ABNORMAL HIGH (ref 70–99)

## 2018-07-02 SURGERY — REPAIR, TENDON, ACHILLES
Anesthesia: General | Laterality: Right

## 2018-07-02 MED ORDER — FENTANYL CITRATE (PF) 100 MCG/2ML IJ SOLN
INTRAMUSCULAR | Status: AC
  Filled 2018-07-02: qty 2

## 2018-07-02 MED ORDER — SEVOFLURANE IN SOLN
RESPIRATORY_TRACT | Status: AC
  Filled 2018-07-02: qty 250

## 2018-07-02 MED ORDER — DEXTROSE 5 % IV SOLN
INTRAVENOUS | Status: DC
  Administered 2018-07-02: 09:00:00 via INTRAVENOUS

## 2018-07-02 MED ORDER — SODIUM CHLORIDE 0.9 % IV SOLN
INTRAVENOUS | Status: DC
  Administered 2018-07-02: 09:00:00 via INTRAVENOUS

## 2018-07-02 MED ORDER — CLINDAMYCIN PHOSPHATE 900 MG/50ML IV SOLN
INTRAVENOUS | Status: AC
  Filled 2018-07-02: qty 50

## 2018-07-02 MED ORDER — MIDAZOLAM HCL 2 MG/2ML IJ SOLN
INTRAMUSCULAR | Status: AC
  Filled 2018-07-02: qty 2

## 2018-07-02 MED ORDER — PROPOFOL 10 MG/ML IV BOLUS
INTRAVENOUS | Status: AC
  Filled 2018-07-02: qty 20

## 2018-07-02 MED ORDER — BUPIVACAINE LIPOSOME 1.3 % IJ SUSP
INTRAMUSCULAR | Status: AC
  Filled 2018-07-02: qty 20

## 2018-07-02 MED ORDER — METOCLOPRAMIDE HCL 5 MG/ML IJ SOLN: 5.0000 mg | Freq: Three times a day (TID) | INTRAMUSCULAR | Status: DC | PRN

## 2018-07-02 MED ORDER — BUPIVACAINE HCL (PF) 0.25 % IJ SOLN
INTRAMUSCULAR | Status: DC | PRN
  Administered 2018-07-02: 10 mL

## 2018-07-02 MED ORDER — PROPOFOL 10 MG/ML IV BOLUS
INTRAVENOUS | Status: DC | PRN
  Administered 2018-07-02: 110 mg via INTRAVENOUS

## 2018-07-02 MED ORDER — ACETAMINOPHEN 10 MG/ML IV SOLN
INTRAVENOUS | Status: AC
  Filled 2018-07-02: qty 100

## 2018-07-02 MED ORDER — OXYCODONE-ACETAMINOPHEN 5-325 MG PO TABS: 1.0000 | ORAL_TABLET | Freq: Four times a day (QID) | ORAL | 0 refills | Status: DC | PRN

## 2018-07-02 MED ORDER — CHLORHEXIDINE GLUCONATE 4 % EX LIQD
60.0000 mL | Freq: Once | CUTANEOUS | Status: AC
  Administered 2018-07-02: 4 via TOPICAL

## 2018-07-02 MED ORDER — BUPIVACAINE-EPINEPHRINE (PF) 0.25% -1:200000 IJ SOLN
INTRAMUSCULAR | Status: AC
  Filled 2018-07-02: qty 30

## 2018-07-02 MED ORDER — LACTATED RINGERS IV SOLN
INTRAVENOUS | Status: DC | PRN
  Administered 2018-07-02: 13:00:00 via INTRAVENOUS

## 2018-07-02 MED ORDER — ONDANSETRON HCL 4 MG/2ML IJ SOLN: 4.0000 mg | Freq: Four times a day (QID) | INTRAMUSCULAR | Status: DC | PRN

## 2018-07-02 MED ORDER — EPHEDRINE SULFATE 50 MG/ML IJ SOLN
INTRAMUSCULAR | Status: DC | PRN
  Administered 2018-07-02: 15 mg via INTRAVENOUS

## 2018-07-02 MED ORDER — LIDOCAINE HCL (CARDIAC) PF 100 MG/5ML IV SOSY
PREFILLED_SYRINGE | INTRAVENOUS | Status: DC | PRN
  Administered 2018-07-02: 40 mg via INTRAVENOUS

## 2018-07-02 MED ORDER — FENTANYL CITRATE (PF) 100 MCG/2ML IJ SOLN
INTRAMUSCULAR | Status: DC | PRN
  Administered 2018-07-02: 25 ug via INTRAVENOUS
  Administered 2018-07-02: 100 ug via INTRAVENOUS

## 2018-07-02 MED ORDER — ROCURONIUM BROMIDE 100 MG/10ML IV SOLN
INTRAVENOUS | Status: DC | PRN
  Administered 2018-07-02: 40 mg via INTRAVENOUS

## 2018-07-02 MED ORDER — ONDANSETRON HCL 4 MG/2ML IJ SOLN
INTRAMUSCULAR | Status: DC | PRN
  Administered 2018-07-02: 4 mg via INTRAVENOUS

## 2018-07-02 MED ORDER — ACETAMINOPHEN 10 MG/ML IV SOLN
INTRAVENOUS | Status: DC | PRN
  Administered 2018-07-02: 1000 mg via INTRAVENOUS

## 2018-07-02 MED ORDER — ONDANSETRON HCL 4 MG PO TABS: 4.0000 mg | ORAL_TABLET | Freq: Four times a day (QID) | ORAL | Status: DC | PRN

## 2018-07-02 MED ORDER — MIDAZOLAM HCL 2 MG/2ML IJ SOLN
INTRAMUSCULAR | Status: DC | PRN
  Administered 2018-07-02: 2 mg via INTRAVENOUS

## 2018-07-02 MED ORDER — BUPIVACAINE LIPOSOME 1.3 % IJ SUSP
INTRAMUSCULAR | Status: DC | PRN
  Administered 2018-07-02: 20 mL

## 2018-07-02 MED ORDER — METOCLOPRAMIDE HCL 10 MG PO TABS: 5.0000 mg | ORAL_TABLET | Freq: Three times a day (TID) | ORAL | Status: DC | PRN

## 2018-07-02 SURGICAL SUPPLY — 59 items
ANCHOR 4.5 FOOTPRINT ULTRA (Anchor) ×4 IMPLANT
BANDAGE ELASTIC 4 LF NS (GAUZE/BANDAGES/DRESSINGS) ×4 IMPLANT
BIT DRILL 4X4.5 FOOTPRINT STR (BIT) ×1 IMPLANT
BLADE SURG 15 STRL LF DISP TIS (BLADE) ×2 IMPLANT
BLADE SURG 15 STRL SS (BLADE) ×2
BLADE SURG MINI STRL (BLADE) ×2 IMPLANT
BNDG COHESIVE 4X5 TAN STRL (GAUZE/BANDAGES/DRESSINGS) ×2 IMPLANT
BNDG CONFORM 2 STRL LF (GAUZE/BANDAGES/DRESSINGS) ×2 IMPLANT
BNDG CONFORM 3 STRL LF (GAUZE/BANDAGES/DRESSINGS) ×2 IMPLANT
BNDG ESMARK 4X12 TAN STRL LF (GAUZE/BANDAGES/DRESSINGS) IMPLANT
BNDG ESMARK 6X12 TAN STRL LF (GAUZE/BANDAGES/DRESSINGS) ×2 IMPLANT
CANISTER SUCT 1200ML W/VALVE (MISCELLANEOUS) ×2 IMPLANT
COVER WAND RF STERILE (DRAPES) ×2 IMPLANT
CUFF TOURN SGL QUICK 30 (TOURNIQUET CUFF) ×1
CUFF TRNQT CYL 30X4X21-28X (TOURNIQUET CUFF) ×1 IMPLANT
DRAPE FLUOR MINI C-ARM 54X84 (DRAPES) ×2 IMPLANT
DRILL 4X4.5 FOOTPRINT STR (BIT) ×2
DURAPREP 26ML APPLICATOR (WOUND CARE) ×2 IMPLANT
ELECT REM PT RETURN 9FT ADLT (ELECTROSURGICAL) ×2
ELECTRODE REM PT RTRN 9FT ADLT (ELECTROSURGICAL) ×1 IMPLANT
GAUZE SPONGE 4X4 12PLY STRL (GAUZE/BANDAGES/DRESSINGS) ×2 IMPLANT
GAUZE XEROFORM 1X8 LF (GAUZE/BANDAGES/DRESSINGS) ×2 IMPLANT
GLOVE BIO SURGEON STRL SZ7.5 (GLOVE) ×2 IMPLANT
GLOVE INDICATOR 8.0 STRL GRN (GLOVE) ×2 IMPLANT
GOWN STRL REUS W/ TWL LRG LVL3 (GOWN DISPOSABLE) ×2 IMPLANT
GOWN STRL REUS W/TWL LRG LVL3 (GOWN DISPOSABLE) ×2
HANDLE YANKAUER SUCT BULB TIP (MISCELLANEOUS) ×2 IMPLANT
KIT TURNOVER KIT A (KITS) ×2 IMPLANT
LABEL OR SOLS (LABEL) ×2 IMPLANT
NDL MAYO CATGUT SZ5 (NEEDLE)
NDL SUT 5 .5 CRC TPR PNT MAYO (NEEDLE) IMPLANT
NEEDLE FILTER BLUNT 18X 1/2SAF (NEEDLE) ×1
NEEDLE FILTER BLUNT 18X1 1/2 (NEEDLE) ×1 IMPLANT
NEEDLE HYPO 25X1 1.5 SAFETY (NEEDLE) ×6 IMPLANT
NS IRRIG 500ML POUR BTL (IV SOLUTION) ×4 IMPLANT
PACK EXTREMITY ARMC (MISCELLANEOUS) ×2 IMPLANT
PAD CAST CTTN 4X4 STRL (SOFTGOODS) ×1 IMPLANT
PADDING CAST COTTON 4X4 STRL (SOFTGOODS) ×1
RASP SM TEAR CROSS CUT (RASP) ×2 IMPLANT
SPLINT CAST 1 STEP 5X30 WHT (MISCELLANEOUS) IMPLANT
SPLINT FAST PLASTER 5X30 (CAST SUPPLIES)
SPLINT PLASTER CAST FAST 5X30 (CAST SUPPLIES) IMPLANT
SPONGE LAP 18X18 RF (DISPOSABLE) ×2 IMPLANT
STOCKINETTE M/LG 89821 (MISCELLANEOUS) ×2 IMPLANT
STRIP CLOSURE SKIN 1/2X4 (GAUZE/BANDAGES/DRESSINGS) ×2 IMPLANT
SUT MNCRL+ 5-0 VIOLET P-3 (SUTURE) ×1 IMPLANT
SUT MONOCRYL 5-0 (SUTURE) ×1
SUT PDS AB 0 CT1 27 (SUTURE) IMPLANT
SUT VIC AB 0 SH 27 (SUTURE) IMPLANT
SUT VIC AB 2-0 SH 27 (SUTURE)
SUT VIC AB 2-0 SH 27XBRD (SUTURE) IMPLANT
SUT VIC AB 3-0 SH 27 (SUTURE) ×1
SUT VIC AB 3-0 SH 27X BRD (SUTURE) ×1 IMPLANT
SUT VIC AB 4-0 FS2 27 (SUTURE) ×2 IMPLANT
SUT VICRYL AB 3-0 FS1 BRD 27IN (SUTURE) ×2 IMPLANT
SWABSTK COMLB BENZOIN TINCTURE (MISCELLANEOUS) ×2 IMPLANT
SYR 10ML LL (SYRINGE) ×4 IMPLANT
SYR 3ML LL SCALE MARK (SYRINGE) ×2 IMPLANT
WIRE MAGNUM (SUTURE) ×2 IMPLANT

## 2018-07-02 NOTE — H&P (Signed)
HISTORY AND PHYSICAL INTERVAL NOTE:  07/02/2018  12:04 PM  Linda Harrison  has presented today for surgery, with the diagnosis of ACHILLES RUPTURE.  The various methods of treatment have been discussed with the patient.  No guarantees were given.  After consideration of risks, benefits and other options for treatment, the patient has consented to surgery.  I have reviewed the patients' chart and labs.     Harrison history and physical examination was performed in my office.  The patient was reexamined.  There have been no changes to this history and physical examination.  Linda Harrison

## 2018-07-02 NOTE — Anesthesia Preprocedure Evaluation (Signed)
Anesthesia Evaluation  Patient identified by MRN, date of birth, ID band Patient awake    Reviewed: Allergy & Precautions, H&P , NPO status , Patient's Chart, lab work & pertinent test results, reviewed documented beta blocker date and time   Airway Mallampati: II  TM Distance: >3 FB Neck ROM: full    Dental  (+) Teeth Intact   Pulmonary asthma ,    Pulmonary exam normal        Cardiovascular Exercise Tolerance: Poor hypertension, On Medications negative cardio ROS Normal cardiovascular exam Rhythm:regular Rate:Normal     Neuro/Psych negative neurological ROS  negative psych ROS   GI/Hepatic negative GI ROS, Neg liver ROS,   Endo/Other  negative endocrine ROSdiabetes, Well Controlled, Type 2, Oral Hypoglycemic Agents  Renal/GU negative Renal ROS  negative genitourinary   Musculoskeletal   Abdominal   Peds  Hematology negative hematology ROS (+)   Anesthesia Other Findings Past Medical History: No date: Asthma No date: Diabetes mellitus without complication (HCC) No date: Hyperlipidemia No date: Hypertension Past Surgical History: 01/2012: ARTHROSCOPY OF KNEE; Left No date: BARTHOLIN GLAND CYST EXCISION 1979: CESAREAN SECTION 06/09/2017: COLONOSCOPY WITH PROPOFOL; N/A     Comment:  Procedure: COLONOSCOPY WITH PROPOFOL;  Surgeon:               Lollie Sails, MD;  Location: [facility] ENDOSCOPY;                Service: Endoscopy;  Laterality: N/A; 01/2014: THYROID LOBECTOMY; Right   Reproductive/Obstetrics negative OB ROS                             Anesthesia Physical Anesthesia Plan  ASA: II  Anesthesia Plan: General ETT   Post-op Pain Management:    Induction:   PONV Risk Score and Plan: 4 or greater  Airway Management Planned:   Additional Equipment:   Intra-op Plan:   Post-operative Plan:   Informed Consent: I have reviewed the patients History and Physical,  chart, labs and discussed the procedure including the risks, benefits and alternatives for the proposed anesthesia with the patient or authorized representative who has indicated his/her understanding and acceptance.     Dental Advisory Given  Plan Discussed with: CRNA  Anesthesia Plan Comments:        Anesthesia Quick Evaluation

## 2018-07-02 NOTE — Discharge Instructions (Signed)
[facility] °[facility] ° °POST OPERATIVE INSTRUCTIONS FOR DR. TROXLER AND DR. FOWLER °[facility] ° ° °1. Take your medication as prescribed.  Pain medication should be taken only as needed. ° °2. Keep the dressing clean, dry and intact. ° °3. Keep your foot elevated above the heart level for the first 48 hours. ° °4. Walking to the bathroom and brief periods of walking are acceptable, unless we have instructed you to be non-weight bearing. ° °5. Always wear your post-op shoe when walking.  Always use your crutches if you are to be non-weight bearing. ° °6. Do not take a shower. Baths are permissible as long as the foot is kept out of the water.  ° °7. Every hour you are awake:  °- Bend your knee 15 times. ° °8. Call [facility] ([phone]) if any of the following problems occur: °- You develop a temperature or fever. °- The bandage becomes saturated with blood. °- Medication does not stop your pain. °- Injury of the foot occurs. °- Any symptoms of infection including redness, odor, or red streaks running from wound. ° ° ° °AMBULATORY SURGERY  °DISCHARGE INSTRUCTIONS ° ° °1) The drugs that you were given will stay in your system until tomorrow so for the next 24 hours you should not: ° °A) Drive an automobile °B) Make any legal decisions °C) Drink any alcoholic beverage ° ° °2) You may resume regular meals tomorrow.  Today it is better to start with liquids and gradually work up to solid foods. ° °You may eat anything you prefer, but it is better to start with liquids, then soup and crackers, and gradually work up to solid foods. ° ° °3) Please notify your doctor immediately if you have any unusual bleeding, trouble breathing, redness and pain at the surgery site, drainage, fever, or pain not relieved by medication. ° ° ° °4) Additional Instructions: ° ° ° ° ° ° ° °Please contact your physician with any problems or Same Day Surgery at [phone],  Monday through Friday 6 am to 4 pm, or  at [facility] number at [phone]. ° °

## 2018-07-02 NOTE — Transfer of Care (Signed)
Immediate Anesthesia Transfer of Care Note  Patient: Linda Harrison  Procedure(s) Performed: Procedure(s): ACHILLES TENDON REPAIR PRIMARY LEFT, DIABETIC (Right) OSTECTOMY BONE SPUR REMOVAL LEFT (Right)  Patient Location: PACU  Anesthesia Type:General  Level of Consciousness: sedated  Airway & Oxygen Therapy: Patient Spontanous Breathing and Patient connected to face mask oxygen  Post-op Assessment: Report given to RN and Post -op Vital signs reviewed and stable  Post vital signs: Reviewed and stable  Last Vitals:  Vitals:   07/02/18 0916 07/02/18 1457  BP: (!) 156/66 126/66  Pulse: (!) 57   Resp: 18 18  Temp: 36.4 C 36.4 C  SpO2: 045% 409%    Complications: No apparent anesthesia complications

## 2018-07-02 NOTE — Op Note (Signed)
Operative note   Surgeon:Kunta Hilleary Lawyer: None    Preop diagnosis: 1 acute Achilles tendon rupture 2.  Calcaneal exostosis right lower extremity    Postop diagnosis: Same    Procedure: Achilles tendon repair right lower extremity 2 calcaneal exostectomy right lower extremity    EBL: Minimal    Anesthesia:local and general.  Local consisted of 10 mL's of 0.25% bupivacaine and 20 mL's of Exparel long-acting anesthetic    Hemostasis: Thigh tourniquet inflated to 250 mmHg for approximately 80 minutes    Specimen: Bone spur and tendinitis    Complications: None    Operative indications:Linda Harrison is an 68 y.o. that presents today for surgical intervention.  The risks/benefits/alternatives/complications have been discussed and consent has been given.    Procedure:  Patient was brought into the OR and placed on the operating table in theprone position. After anesthesia was obtained theright lower extremity was prepped and draped in usual sterile fashion.  Attention was directed to the posterior aspect of the right heel where a curvilinear incision was performed starting at the Achilles region along the watershed band and ending at the most distal aspect of the calcaneus.  The incision was taken just medial at the distal aspect as a small area of erythema consistent with pressure on the posterior skin was noted.  Full-thickness incision was taken down to the peritenon.  The peritenon was then incised.  The Achilles rupture was noted to be complete.  This appeared to rupture completely off of the posterior aspect of the calcaneus at the calcaneal exostosis.  Small residual fibers were noted attached to the exostosis.  At this time with a combination of osteotome and a rasp I was able to remove all of the posterior calcaneal exostosis and inflamed tendon.  This was smoothed with a power rasp to good healthy bleeding bone.  The wound was flushed with copious amounts of irrigation.   Next a #2 FiberWire was used on the distal Achilles itself and a Krakw suture type.  This was then tacked into the posterior calcaneus with a 5.5 mm footprint under tension.    The initial bone anchor did not stabilize the suture within the bone anchor and the suture was then removed from this bone anchor.  A second bone anchor was placed just distal in the midline of the posterior calcaneus.  The foot was in a slight plantarflexed position consistent with the contralateral side.  Good stability was noted.  This time the wound was flushed with copious amounts of irrigation.  Further suturing of the distal aspect of the Achilles to the surrounding soft tissue was performed with a 3-0 Vicryl.  Closure of the peritenon was performed with a 4-0 Vicryl and the subcutaneous tissue was closed with a 4-0 Vicryl.  The skin was reapproximated with a 5-0 Monocryl in a subcuticular fashion.  The area was infiltrated with the local anesthetic as described above.  Steri-Strips and a bulky padded dressing was applied.  Patient was held in gravity equinus and a posterior splint was applied.    Patient tolerated the procedure and anesthesia well.  Was transported from the OR to the PACU with all vital signs stable and vascular status intact. To be discharged per routine protocol.  Will follow up in approximately 1 week in the outpatient clinic.

## 2018-07-02 NOTE — Anesthesia Post-op Follow-up Note (Signed)
Anesthesia QCDR form completed.        

## 2018-07-02 NOTE — Anesthesia Procedure Notes (Signed)
Procedure Name: Intubation Date/Time: 07/02/2018 12:55 PM Performed by: Justus Memory, CRNA Pre-anesthesia Checklist: Patient identified, Patient being monitored, Timeout performed, Emergency Drugs available and Suction available Patient Re-evaluated:Patient Re-evaluated prior to induction Oxygen Delivery Method: Circle system utilized Preoxygenation: Pre-oxygenation with 100% oxygen Induction Type: IV induction Ventilation: Mask ventilation without difficulty Laryngoscope Size: Mac and 3 Grade View: Grade I Tube type: Oral Tube size: 7.0 mm Number of attempts: 1 Airway Equipment and Method: Stylet Placement Confirmation: ETT inserted through vocal cords under direct vision,  positive ETCO2 and breath sounds checked- equal and bilateral Secured at: 21 cm Tube secured with: Tape Dental Injury: Teeth and Oropharynx as per pre-operative assessment

## 2018-07-05 ENCOUNTER — Encounter: Payer: Self-pay | Admitting: Podiatry

## 2018-07-07 LAB — SURGICAL PATHOLOGY

## 2018-07-08 NOTE — Anesthesia Postprocedure Evaluation (Signed)
Anesthesia Post Note  Patient: Linda Harrison  Procedure(s) Performed: ACHILLES TENDON REPAIR PRIMARY LEFT, DIABETIC (Right ) OSTECTOMY BONE SPUR REMOVAL LEFT (Right )  Patient location during evaluation: PACU Anesthesia Type: General Level of consciousness: awake and alert Pain management: pain level controlled Vital Signs Assessment: post-procedure vital signs reviewed and stable Respiratory status: spontaneous breathing, nonlabored ventilation, respiratory function stable and patient connected to nasal cannula oxygen Cardiovascular status: blood pressure returned to baseline and stable Postop Assessment: no apparent nausea or vomiting Anesthetic complications: no     Last Vitals:  Vitals:   07/02/18 1542 07/02/18 1557  BP: 137/76 (!) 140/53  Pulse: (!) 59 (!) 58  Resp: 13 18  Temp: 36.5 C 36.7 C  SpO2: 100% 100%    Last Pain:  Vitals:   07/02/18 1557  TempSrc: Temporal  PainSc: 0-No pain                 Molli Barrows

## 2018-11-23 ENCOUNTER — Other Ambulatory Visit: Payer: Self-pay | Admitting: Family Medicine

## 2018-11-23 DIAGNOSIS — Z1231 Encounter for screening mammogram for malignant neoplasm of breast: Secondary | ICD-10-CM

## 2018-12-27 ENCOUNTER — Ambulatory Visit
Admission: RE | Admit: 2018-12-27 | Discharge: 2018-12-27 | Disposition: A | Discharge status: Home / Self Care | Payer: Medicare HMO | Source: Ambulatory Visit | Attending: Family Medicine | Admitting: Family Medicine

## 2018-12-27 DIAGNOSIS — Z1231 Encounter for screening mammogram for malignant neoplasm of breast: Secondary | ICD-10-CM | POA: Diagnosis not present

## 2019-01-04 ENCOUNTER — Encounter: Payer: Self-pay | Admitting: Emergency Medicine

## 2019-01-04 ENCOUNTER — Ambulatory Visit
Admission: EM | Admit: 2019-01-04 | Discharge: 2019-01-04 | Disposition: A | Discharge status: Home / Self Care | Payer: Medicare HMO | Attending: Urgent Care | Admitting: Urgent Care

## 2019-01-04 ENCOUNTER — Other Ambulatory Visit: Payer: Self-pay

## 2019-01-04 DIAGNOSIS — R21 Rash and other nonspecific skin eruption: Secondary | ICD-10-CM | POA: Diagnosis not present

## 2019-01-04 DIAGNOSIS — L03116 Cellulitis of left lower limb: Secondary | ICD-10-CM

## 2019-01-04 MED ORDER — DOXYCYCLINE HYCLATE 100 MG PO CAPS: 100.0000 mg | ORAL_CAPSULE | Freq: Two times a day (BID) | ORAL | 0 refills | Status: AC

## 2019-01-04 MED ORDER — PREDNISONE 10 MG (21) PO TBPK: ORAL_TABLET | Freq: Every day | ORAL | 0 refills | Status: AC

## 2019-01-04 NOTE — Discharge Instructions (Addendum)
It was very nice seeing you today in clinic. Thank you for entrusting me with your care.   Use medications as prescribed. Monitor for signs and symptoms of infection, which would include increased redness, swelling, streaking, drainage, pain, and the development of a fever.   Make arrangements to follow up with your regular doctor in 1 week for re-evaluation if not improving. If your symptoms/condition worsens, please seek follow up care either here or in the ER. Please remember, our [facility] providers are "right here with you" when you need [place].   Again, it was my pleasure to take care of you today. Thank you for choosing our clinic. I hope that you start to feel better quickly.   Honor Loh, MSN, APRN, FNP-C, CEN Advanced Practice Provider [facility]

## 2019-01-04 NOTE — ED Provider Notes (Signed)
[place], [place]   Name: Linda FlaxDeborah W Byas DOB: 06/13/1950 MRN: [id number] CSN: [id number] PCP: Jerl MinaHedrick, James, MD  Arrival date and time:  01/04/19 1030  Chief Complaint:  Rash   NOTE: Prior to seeing the patient today, I have reviewed the triage nursing documentation and vital signs. Clinical staff has updated patient's PMH/PSHx, current medication list, and drug allergies/intolerances to ensure comprehensive history available to assist in medical decision making.   History:   HPI: Linda Harrison is a 68 y.o. female who presents today with complaints of recurrent rash to the anterior aspect of her LEFT distal lower extremity that declared this time 2 days ago. Etiology of rash unknown. Patient denies any new medications, foods, soaps/body washes, laundry detergents, or cosmetics. She denies a past medical history significant for environmental allergies. She advises that she has developed a skin eruption like this in the past that has resulted in cellulitis twice. Rash is erythematous, pruritic, and tender to touch. She has not appreciated any drainage associated with the rash. There is no facial involvement; no rash to periorbital, paranasal, or perioral areas. Patient denies that she is not experiencing any shortness of breath or sensation of pharyngeal/laryngeal fullness. Despite her symptoms, patient has not taken any over the counter interventions to help improve/relieve her reported symptoms at home.    Past Medical History:  Diagnosis Date  . Asthma   . Diabetes mellitus without complication (HCC)   . Hyperlipidemia   . Hypertension     Past Surgical History:  Procedure Laterality Date  . ACHILLES TENDON SURGERY Right 07/02/2018   Procedure: ACHILLES TENDON REPAIR PRIMARY LEFT, DIABETIC;  Surgeon: Gwyneth RevelsFowler, Justin, DPM;  Location: [facility] ORS;  Service: Podiatry;  Laterality: Right;  . ARTHROSCOPY OF KNEE Left 01/2012  . BARTHOLIN GLAND CYST EXCISION    . CESAREAN SECTION  1979   . COLONOSCOPY WITH PROPOFOL N/A 06/09/2017   Procedure: COLONOSCOPY WITH PROPOFOL;  Surgeon: Christena DeemSkulskie, Martin U, MD;  Location: [facility];  Service: Endoscopy;  Laterality: N/A;  . OSTECTOMY Right 07/02/2018   Procedure: OSTECTOMY BONE SPUR REMOVAL LEFT;  Surgeon: Gwyneth RevelsFowler, Justin, DPM;  Location: [facility] ORS;  Service: Podiatry;  Laterality: Right;  . THYROID LOBECTOMY Right 01/2014    Family History  Problem Relation Age of Onset  . Breast cancer Sister [id number]  . Breast cancer Sister [id number]    Social History   Tobacco Use  . Smoking status: Never Smoker  . Smokeless tobacco: Never Used  Substance Use Topics  . Alcohol use: Never  . Drug use: Never    There are no problems to display for this patient.   Home Medications:    Current Meds  Medication Sig  . amLODipine (NORVASC) 5 MG tablet Take 5 mg by mouth daily.  Marland Kitchen. aspirin EC 81 MG tablet Take 81 mg by mouth daily.  . bisoprolol-hydrochlorothiazide (ZIAC) 10-6.25 MG tablet Take 1 tablet by mouth 2 (two) times a day.   . budesonide-formoterol (SYMBICORT) 160-4.5 MCG/ACT inhaler Inhale 2 puffs into the lungs 2 (two) times daily.  . calcium carbonate (OSCAL) 1500 (600 Ca) MG TABS tablet Take 600 mg by mouth daily.  . cetirizine (ZYRTEC) 10 MG tablet Take 10 mg by mouth at bedtime.  . cholecalciferol (VITAMIN D) 25 MCG (1000 UT) tablet Take 1,000 Units by mouth daily.  Marland Kitchen. econazole nitrate 1 % cream Apply 1 application topically daily as needed (skin rash on legs).   Marland Kitchen. ibuprofen (ADVIL,MOTRIN) 200 MG tablet Take 400 mg  by mouth every 8 (eight) hours as needed (pain.).   Marland Kitchen metFORMIN (GLUCOPHAGE) 500 MG tablet Take 500 mg by mouth 2 (two) times a day.  . Multiple Vitamin (MULTIVITAMIN WITH MINERALS) TABS tablet Take 1 tablet by mouth daily.  . Omega-3 Fatty Acids (FISH OIL) 1200 MG CAPS Take 1,200 mg by mouth 2 (two) times a day.  Marland Kitchen omeprazole (PRILOSEC) 20 MG capsule Take 20 mg by mouth 2 (two) times a day.   . simvastatin (ZOCOR) 20 MG  tablet Take 20 mg by mouth at bedtime.   . [DISCONTINUED] oxyCODONE-acetaminophen (PERCOCET) 5-325 MG tablet Take 1-2 tablets by mouth every 6 (six) hours as needed for severe pain. Do not take more than 6 tabs per day    Allergies:   Codeine, Advair diskus [fluticasone-salmeterol], Augmentin [amoxicillin-pot clavulanate], and Latex  Review of Systems (ROS): Review of Systems  Constitutional: Negative for chills, fatigue and fever.  Respiratory: Negative for cough and shortness of breath.   Cardiovascular: Negative for chest pain and palpitations.  Skin: Positive for color change and rash.  All other systems reviewed and are negative.    Vital Signs: Today's Vitals   01/04/19 1042 01/04/19 1043 01/04/19 1045 01/04/19 1124  BP:   (!) 160/67   Pulse:   62   Resp:   18   Temp:   98.5 F (36.9 C)   TempSrc:   Oral   SpO2:   100%   Weight:  170 lb (77.1 kg)    Height:  5' (1.524 m)    PainSc: 0-No pain   0-No pain    Physical Exam: Physical Exam  Constitutional: She is oriented to person, place, and time and well-developed, well-nourished, and in no distress.  HENT:  Head: Normocephalic and atraumatic.  Mouth/Throat: Mucous membranes are normal.  Eyes: Pupils are equal, round, and reactive to light.  Cardiovascular: Normal rate, regular rhythm, normal heart sounds and intact distal pulses.  Pulmonary/Chest: Effort normal and breath sounds normal. No respiratory distress.  Musculoskeletal:        General: Edema (1+ LLE) present.     Comments: No claudication pain with ambulation and dorsiflexion of the foot.   Neurological: She is alert and oriented to person, place, and time. Gait normal.  Skin: Skin is warm and dry. Rash noted. Rash is maculopapular.  Rash erythematous, pruritic, and TTP. No drainage. (+) surrounding erythema and warmth.   Psychiatric: Mood, memory, affect and judgment normal.  Nursing note and vitals reviewed.   Urgent Care Treatments / Results:    LABS: PLEASE NOTE: all labs that were ordered this encounter are listed, however only abnormal results are displayed. Labs Reviewed - No data to display  EKG: -None  RADIOLOGY: No results found.  PROCEDURES: Procedures  MEDICATIONS RECEIVED THIS VISIT: Medications - No data to display  PERTINENT CLINICAL COURSE NOTES/UPDATES:   Initial Impression / Assessment and Plan / Urgent Care Course:  Pertinent labs & imaging results that were available during my care of the patient were personally reviewed by me and considered in my medical decision making (see lab/imaging section of note for values and interpretations).  STEPHNIE PARLIER is a 68 y.o. female who presents to [facility] today with complaints of Rash   Patient is well appearing overall in clinic today. She does not appear to be in any acute distress. Presenting symptoms (see HPI) and exam as documented above. Recurrent skin eruption of unknown etiology. Appears to be consistent with contact  dermatitis. Concern for possible early cellulitis given the surrounding erythema and warmth. Given exam and history of recurrent cellulitis, will proceed with treatment using a 7 day course of doxycycline and a systemic steroid taper. May use oral and/or topical diphenhydramine as needed for pruritis. May use Tylenol and/or Ibuprofen as needed for discomfort.   Discussed follow up with primary care physician in 1 week for re-evaluation. I have reviewed the follow up and strict return precautions for any new or worsening symptoms. Patient is aware of symptoms that would be deemed urgent/emergent, and would thus require further evaluation either here or in the emergency department. At the time of discharge, she verbalized understanding and consent with the discharge plan as it was reviewed with her. All questions were fielded by provider and/or clinic staff prior to patient discharge.    Final Clinical Impressions / Urgent Care Diagnoses:    Final diagnoses:  Rash  Cellulitis of leg, left    New Prescriptions:  [place] Controlled Substance Registry consulted? Not Applicable  Meds ordered this encounter  Medications  . predniSONE (STERAPRED UNI-PAK 21 TAB) 10 MG (21) TBPK tablet    Sig: Take by mouth daily. 60 mg x 1 day, 50 mg x 1 day, 40 mg x 1 day, 30 mg x 1 day, 20 mg x 1 day, 10 mg x 1 day    Dispense:  21 tablet    Refill:  0  . doxycycline (VIBRAMYCIN) 100 MG capsule    Sig: Take 1 capsule (100 mg total) by mouth 2 (two) times daily for 7 days.    Dispense:  14 capsule    Refill:  0    Recommended Follow up Care:  Patient encouraged to follow up with the following provider within the specified time frame, or sooner as dictated by the severity of her symptoms. As always, she was instructed that for any urgent/emergent care needs, she should seek care either here or in the emergency department for more immediate evaluation.  Follow-up Information    Maryland Pink, MD In 1 week.   Specialty: Family Medicine Why: General reassessment of symptoms if not improving Contact information: [address] [place]  [id number] [phone]         NOTE: This note was prepared using Dragon dictation software along with smaller phrase technology. Despite my best ability to proofread, there is the potential that transcriptional errors may still occur from this process, and are completely unintentional.    Karen Kitchens, NP 01/04/19 1247

## 2019-01-04 NOTE — ED Triage Notes (Signed)
Patient c/o rash on her left lower leg that started 2 days ago. Patient states she has had this same rash before.

## 2019-11-18 ENCOUNTER — Other Ambulatory Visit: Payer: Self-pay | Admitting: Family Medicine

## 2019-11-18 DIAGNOSIS — Z1231 Encounter for screening mammogram for malignant neoplasm of breast: Secondary | ICD-10-CM

## 2019-12-28 ENCOUNTER — Other Ambulatory Visit: Payer: Self-pay

## 2019-12-28 ENCOUNTER — Ambulatory Visit
Admission: RE | Admit: 2019-12-28 | Discharge: 2019-12-28 | Disposition: A | Discharge status: Home / Self Care | Payer: Medicare HMO | Source: Ambulatory Visit | Attending: Family Medicine | Admitting: Family Medicine

## 2019-12-28 DIAGNOSIS — Z1231 Encounter for screening mammogram for malignant neoplasm of breast: Secondary | ICD-10-CM | POA: Insufficient documentation

## 2020-04-01 IMAGING — MR MRI OF THE RIGHT ANKLE WITHOUT CONTRAST
5 series · 40 of 40 positions shown · non-contrast
Comparison: None.

CLINICAL DATA: She has been treated for tendonitis of achilles
tendon for 2 months, stood up to walk across [HOSPITAL] days ago and
felt pain at heel resulting in a rupture of achilles tendon.

EXAM:
MRI OF THE RIGHT ANKLE WITHOUT CONTRAST
TECHNIQUE: Multiplanar, multisequence MR imaging of the ankle was performed. No
intravenous contrast was administered.

[Series 4: T2 fat-sat · axial · right · 3.0mm · 0.44mm/px · z∈[-95,+47]mm · 11 of 44 slices shown (1 of 2)]
[im 1/44]
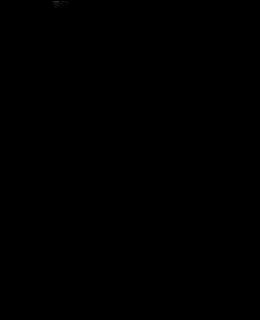
[im 5/44]
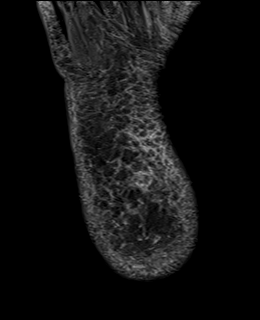
[im 9/44]
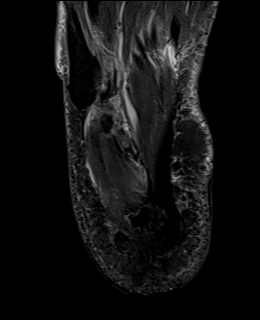
[im 13/44]
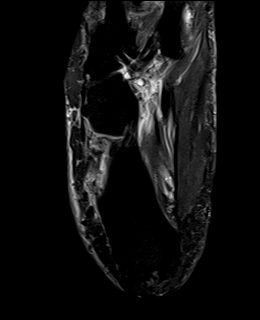
[im 18/44]
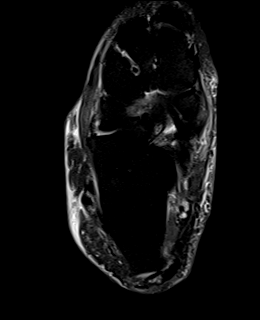
[im 22/44]
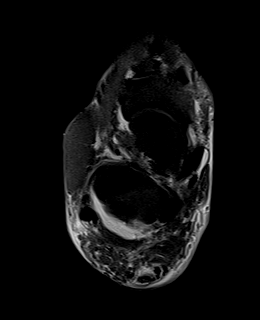
[im 26/44]
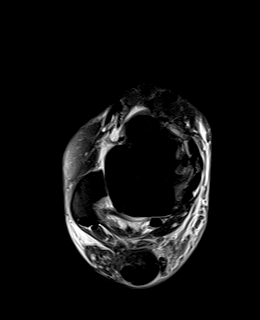
[im 31/44]
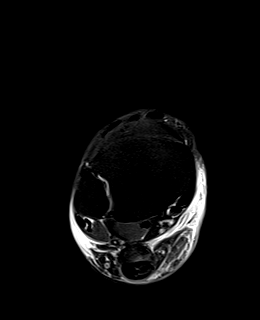
[im 35/44]
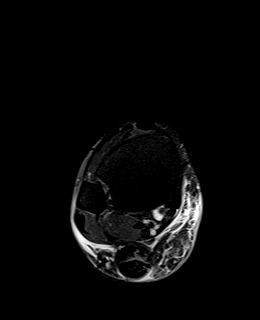
[im 39/44]
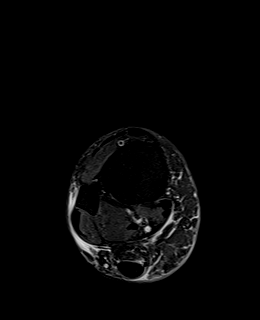
[im 44/44]
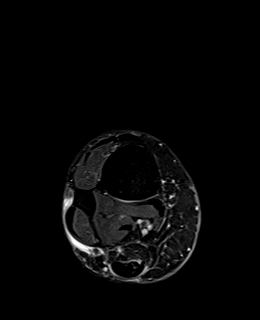

[Series 5: T1 · axial · right · 3.0mm · 0.58mm/px · z∈[-94,+47]mm · 11 of 44 slices shown (1 of 2)]
[im 1/44]
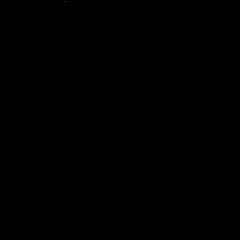
[im 5/44]
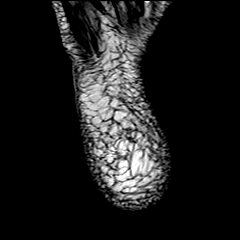
[im 9/44]
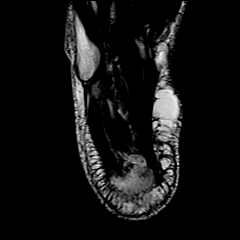
[im 13/44]
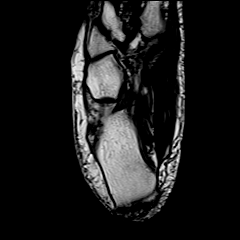
[im 18/44]
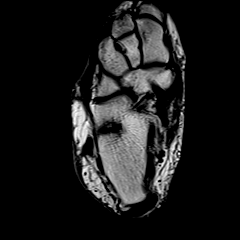
[im 22/44]
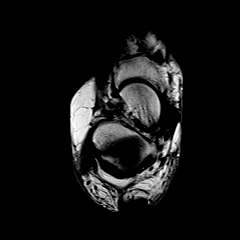
[im 26/44]
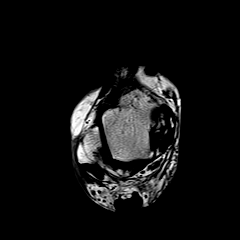
[im 31/44]
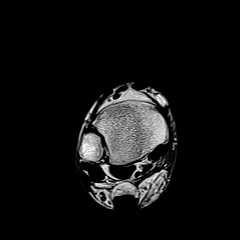
[im 35/44]
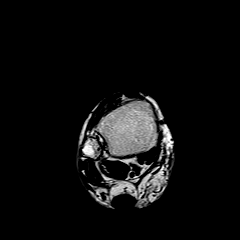
[im 39/44]
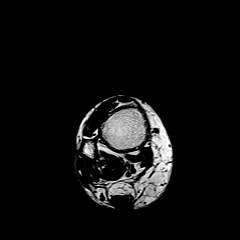
[im 44/44]
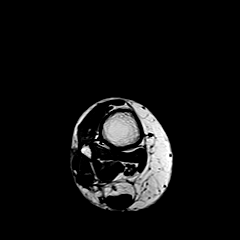

[Series 6: T2 fat-sat · coronal · right · 3.0mm · 0.31mm/px · 8 of 35 slices shown (2 of 2)]
[im 1/35]
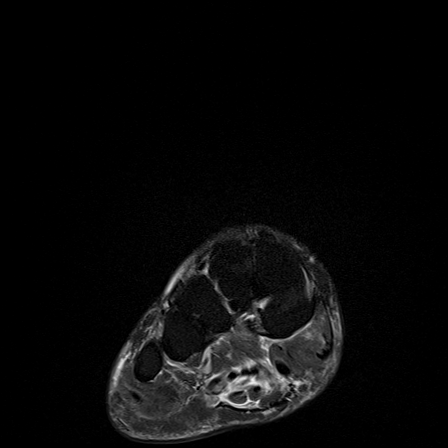
[im 5/35]
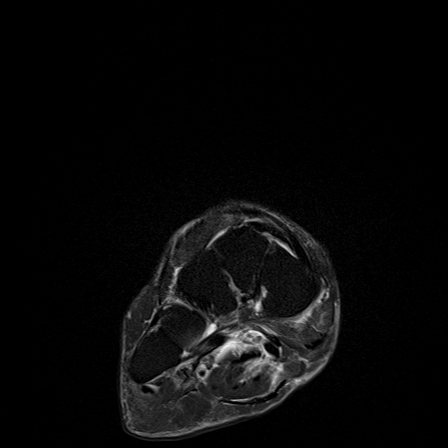
[im 10/35]
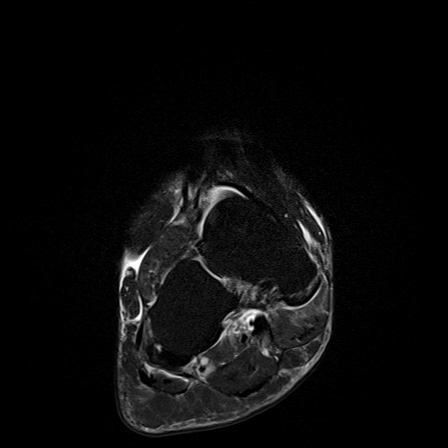
[im 15/35]
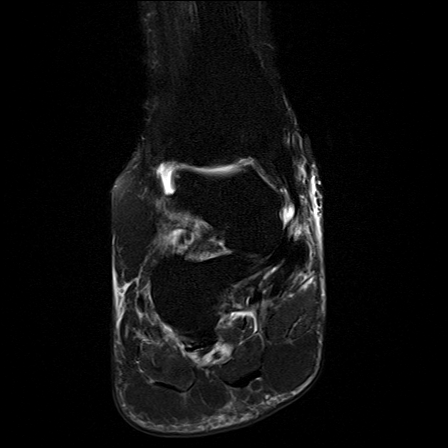
[im 20/35]
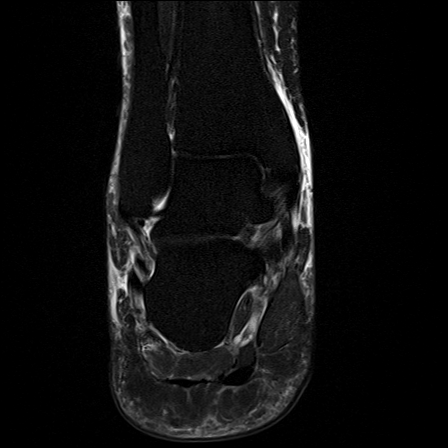
[im 25/35]
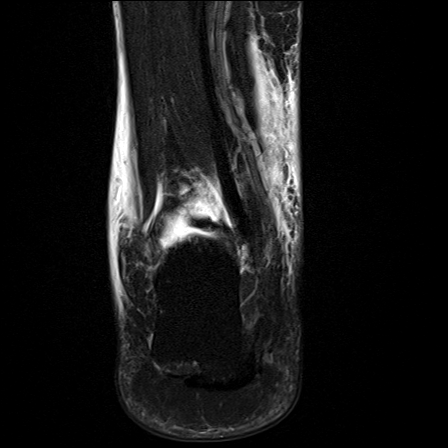
[im 30/35]
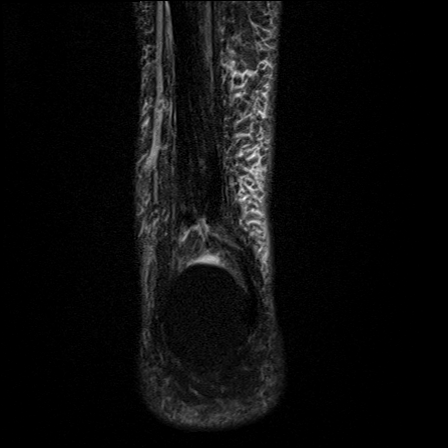
[im 35/35]
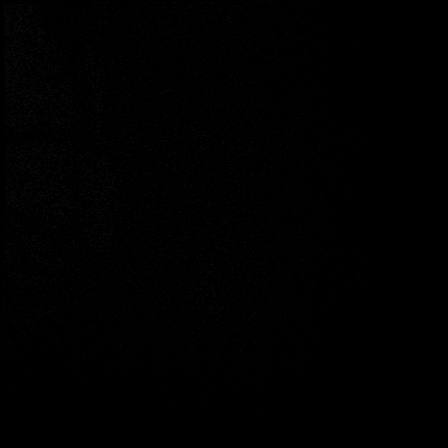

[Series 7: T1 · sagittal · right · 3.0mm · 0.36mm/px · 5 of 21 slices shown (2 of 2)]
[im 1/21]
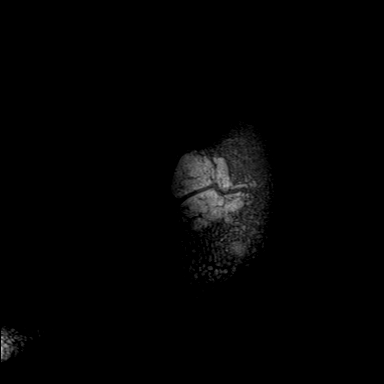
[im 6/21]
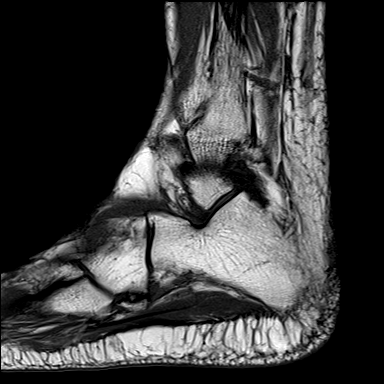
[im 11/21]
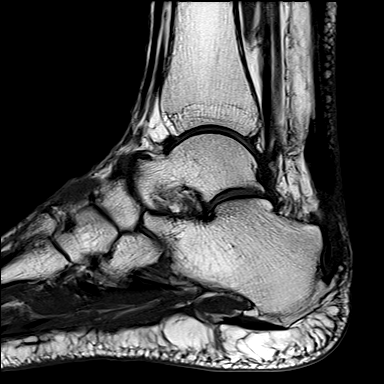
[im 16/21]
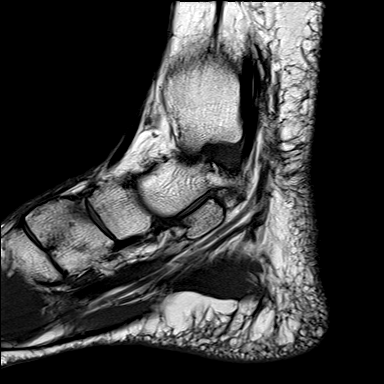
[im 21/21]
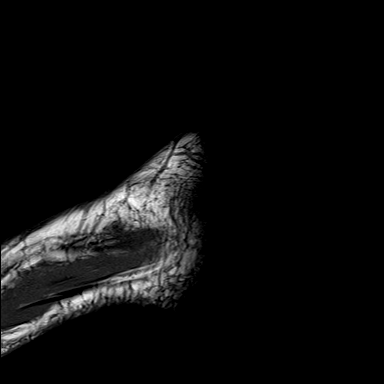

[Series 8: STIR · sagittal · right · 3.0mm · 0.49mm/px · 5 of 21 slices shown]
[im 1/21]
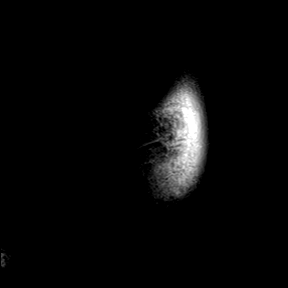
[im 6/21]
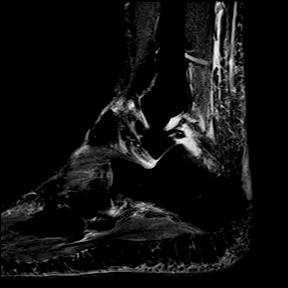
[im 11/21]
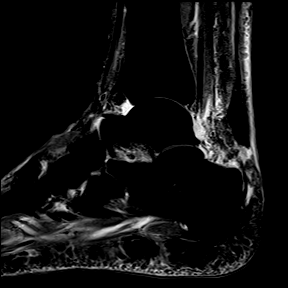
[im 16/21]
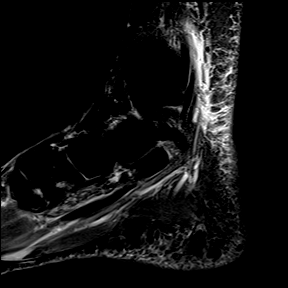
[im 21/21]
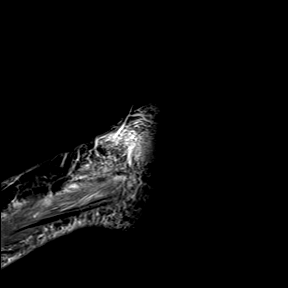

[40 of 40 positions shown; findings below may reference images not displayed]

FINDINGS: TENDONS

Peroneal: Peroneal longus tendon intact. Peroneal brevis intact.

Posteromedial: Posterior tibial tendon intact. Flexor hallucis
longus tendon intact. Flexor digitorum longus tendon intact.

Anterior: Tibialis anterior tendon intact. Extensor hallucis longus
tendon intact Extensor digitorum longus tendon intact.

Achilles: Complete tear of the Achilles tendon just proximal to the
calcaneal insertion with 2 cm of retraction. Mild tendinosis of the
Achilles tendon proximal to the tear. Edema in Kager's fat.

Plantar Fascia: Intact.

LIGAMENTS

Lateral: Anterior talofibular ligament intact. Calcaneofibular
ligament intact. Posterior talofibular ligament intact. Anterior and
posterior tibiofibular ligaments intact.

Medial: Deltoid ligament intact. Spring ligament intact.

CARTILAGE

Ankle Joint: No joint effusion. Normal ankle mortise. No chondral
defect.

Subtalar Joints/Sinus Tarsi: Normal subtalar joints. No subtalar
joint effusion. Normal sinus tarsi.

Bones: No marrow signal abnormality. No fracture or dislocation.
Mild osteoarthritis of the talonavicular joint.

Soft Tissue: No fluid collection or hematoma. Muscles are normal. No
muscle atrophy.
IMPRESSION: 1. Mild tendinosis of the Achilles tendon with a complete tear of
the Achilles tendon just proximal to the calcaneal insertion with 2
cm of retraction.

## 2020-11-16 ENCOUNTER — Other Ambulatory Visit: Payer: Self-pay | Admitting: Family Medicine

## 2020-11-16 DIAGNOSIS — Z1231 Encounter for screening mammogram for malignant neoplasm of breast: Secondary | ICD-10-CM

## 2020-12-28 ENCOUNTER — Ambulatory Visit
Admission: RE | Admit: 2020-12-28 | Discharge: 2020-12-28 | Disposition: A | Discharge status: Home / Self Care | Payer: Medicare HMO | Source: Ambulatory Visit | Attending: Family Medicine | Admitting: Family Medicine

## 2020-12-28 ENCOUNTER — Other Ambulatory Visit: Payer: Self-pay

## 2020-12-28 DIAGNOSIS — Z1231 Encounter for screening mammogram for malignant neoplasm of breast: Secondary | ICD-10-CM | POA: Diagnosis present

## 2021-11-21 ENCOUNTER — Other Ambulatory Visit: Payer: Self-pay

## 2021-11-21 DIAGNOSIS — Z1231 Encounter for screening mammogram for malignant neoplasm of breast: Secondary | ICD-10-CM

## 2021-12-31 ENCOUNTER — Ambulatory Visit
Admission: RE | Admit: 2021-12-31 | Discharge: 2021-12-31 | Disposition: A | Discharge status: Home / Self Care | Payer: Medicare HMO | Source: Ambulatory Visit | Attending: Family Medicine | Admitting: Family Medicine

## 2021-12-31 DIAGNOSIS — Z1231 Encounter for screening mammogram for malignant neoplasm of breast: Secondary | ICD-10-CM | POA: Insufficient documentation

## 2022-11-20 ENCOUNTER — Ambulatory Visit: Payer: Medicare HMO

## 2022-11-20 DIAGNOSIS — D120 Benign neoplasm of cecum: Secondary | ICD-10-CM | POA: Diagnosis not present

## 2022-11-20 DIAGNOSIS — K635 Polyp of colon: Secondary | ICD-10-CM | POA: Diagnosis not present

## 2022-11-20 DIAGNOSIS — Z83719 Family history of colon polyps, unspecified: Secondary | ICD-10-CM | POA: Diagnosis not present

## 2022-11-20 DIAGNOSIS — K5730 Diverticulosis of large intestine without perforation or abscess without bleeding: Secondary | ICD-10-CM | POA: Diagnosis not present

## 2022-11-20 DIAGNOSIS — Z1211 Encounter for screening for malignant neoplasm of colon: Secondary | ICD-10-CM | POA: Diagnosis present

## 2022-11-20 DIAGNOSIS — K640 First degree hemorrhoids: Secondary | ICD-10-CM | POA: Diagnosis not present

## 2022-11-25 ENCOUNTER — Other Ambulatory Visit: Payer: Self-pay | Admitting: Family Medicine

## 2022-11-25 DIAGNOSIS — Z1231 Encounter for screening mammogram for malignant neoplasm of breast: Secondary | ICD-10-CM

## 2023-01-06 ENCOUNTER — Ambulatory Visit
Admission: RE | Admit: 2023-01-06 | Discharge: 2023-01-06 | Disposition: A | Discharge status: Home / Self Care | Payer: Medicare HMO | Source: Ambulatory Visit | Attending: Family Medicine | Admitting: Family Medicine

## 2023-01-06 DIAGNOSIS — Z1231 Encounter for screening mammogram for malignant neoplasm of breast: Secondary | ICD-10-CM | POA: Diagnosis present

## 2023-12-02 ENCOUNTER — Other Ambulatory Visit: Payer: Self-pay | Admitting: Family Medicine

## 2023-12-02 DIAGNOSIS — Z1231 Encounter for screening mammogram for malignant neoplasm of breast: Secondary | ICD-10-CM

## 2024-01-08 ENCOUNTER — Ambulatory Visit
Admission: RE | Admit: 2024-01-08 | Discharge: 2024-01-08 | Disposition: A | Discharge status: Home / Self Care | Source: Ambulatory Visit | Attending: Family Medicine | Admitting: Family Medicine

## 2024-01-08 DIAGNOSIS — Z1231 Encounter for screening mammogram for malignant neoplasm of breast: Secondary | ICD-10-CM | POA: Diagnosis present
